# Patient Record
Sex: Female | Born: 1960 | Race: Black or African American | Hispanic: No | Marital: Single
Health system: Northeastern US, Academic
[De-identification: ages and names within clinical notes are randomized; demographics above are authoritative.]

---

## 2019-05-17 ENCOUNTER — Encounter: Admit: 2019-05-17 | Payer: PRIVATE HEALTH INSURANCE

## 2019-05-17 DIAGNOSIS — E119 Type 2 diabetes mellitus without complications: Secondary | ICD-10-CM

## 2019-06-11 ENCOUNTER — Encounter: Admit: 2019-06-11 | Payer: PRIVATE HEALTH INSURANCE

## 2019-06-11 ENCOUNTER — Ambulatory Visit: Admit: 2019-06-11 | Payer: PRIVATE HEALTH INSURANCE | Attending: Registered"

## 2019-06-11 ENCOUNTER — Telehealth: Admit: 2019-06-11 | Payer: PRIVATE HEALTH INSURANCE | Attending: Registered"

## 2019-06-11 NOTE — Telephone Encounter
Called pt to schedule appt. No Answer, left VM, and sent Mychart message to contact our office.Please next available with Toniann Fail

## 2019-06-15 ENCOUNTER — Telehealth: Admit: 2019-06-15 | Payer: PRIVATE HEALTH INSURANCE | Attending: Registered"

## 2019-06-15 ENCOUNTER — Telehealth: Admit: 2019-06-15 | Payer: PRIVATE HEALTH INSURANCE

## 2019-06-15 NOTE — Telephone Encounter
Called pt to schedule appointment, left voicemail and my chart message to contact office.Please schedule next available visit with Toniann Fail

## 2019-06-15 NOTE — Telephone Encounter
Bariatric surgery processRescheduling Nutrition appointment

## 2019-07-02 ENCOUNTER — Encounter: Admit: 2019-07-02 | Payer: MEDICAID | Attending: Interventional Cardiology

## 2019-07-02 ENCOUNTER — Encounter: Admit: 2019-07-02 | Payer: PRIVATE HEALTH INSURANCE | Attending: Interventional Cardiology

## 2019-07-02 DIAGNOSIS — I480 Paroxysmal atrial fibrillation: Secondary | ICD-10-CM

## 2019-07-02 DIAGNOSIS — E7800 Pure hypercholesterolemia, unspecified: Secondary | ICD-10-CM

## 2019-07-02 DIAGNOSIS — I4891 Unspecified atrial fibrillation: Secondary | ICD-10-CM

## 2019-07-02 DIAGNOSIS — E611 Iron deficiency: Secondary | ICD-10-CM

## 2019-07-02 DIAGNOSIS — I10 Essential (primary) hypertension: Secondary | ICD-10-CM

## 2019-07-02 DIAGNOSIS — G4733 Obstructive sleep apnea (adult) (pediatric): Secondary | ICD-10-CM

## 2019-07-02 DIAGNOSIS — Z9989 Dependence on other enabling machines and devices: Secondary | ICD-10-CM

## 2019-07-02 DIAGNOSIS — E119 Type 2 diabetes mellitus without complications: Secondary | ICD-10-CM

## 2019-07-02 DIAGNOSIS — G629 Polyneuropathy, unspecified: Secondary | ICD-10-CM

## 2019-07-02 NOTE — Progress Notes
Patient was identified as a fall risk.  Universal Fall precautions protocol maintained during vist; Room was free of clutter, personal items were within reach, and verification that all needs were met prior to leaving the examination room.  Additional Universal Fall Interventions included: Patient informed to remain in chair until clinician is present.Encouraged patient to use their appropriate assistive devices.Family members were encouraged to be with patient.When patient properly clothed and practical, examination room door was left open. Fall risk was identified in EMR with fall risk banner. Unit specific identifiers were applied - including sign outside of room and yellow fall risk bracelet.Fall risk brochures were available.

## 2019-07-02 NOTE — Patient Instructions
Can proceed with surgery If you develop palpitations or feeling your heart racing please let us know

## 2019-07-02 NOTE — Progress Notes
Cardiology Outpatient VisitReason for Visit: follow up History of Present Illness 59 YO F with PMH of HTN, HLD, OSA on CPAP, DM, remote history of a. Fib,  obesity who presents for pre-operative assessment prior to bariatric surgeryShe was seen by Dr. Red Christians in 11/2018 for same reason She had good functional capcity, and previous TTE 10/2017 with normal R/L function, and PET 10/2018 obtained for atypical CP, which was normal with no coroanry calcifications She returns for pre-operative assessment She has no CP SOB palpitations, lightheadedness, dizziness, PND, orthopnea She can climb 2 flights of stairs without CP She did report a history of a. Fib in 2006 and was previously on coumadin.  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  TTE with no LA dilation.  She was symptoamtic w/ palpitations at that time.  She has since had an event monitor without a. Fib in 2019 ECG in clinic NSR Past Medical History:Past Medical History: Diagnosis Date ? A-fib (HC Code)  ? CPAP (continuous positive airway pressure) dependence   has c-pap; doesnt use it ;  too much pressure ? Diabetes mellitus (HC Code)  ? High cholesterol  ? Hypertension  ? Iron deficiency  ? Neuropathy (HC Code)  ? Obstructive sleep apnea   Past Surgical History:Past Surgical History: Procedure Laterality Date ? DENTAL SURGERY   ? SPLIT SLEEP STUDY  03/16/2018  Family Medical History: Social History: Allergies Allergen Reactions ? Lisinopril Angioedema Current Outpatient Medications on File Prior to Visit Medication Sig Dispense Refill ? allopurinoL (ZYLOPRIM) 100 mg tablet Take 1 tablet (100 mg total) by mouth daily. 90 tablet 1 ? aspirin 81 mg EC delayed release tablet Take 1 tablet (81 mg total) by mouth daily. 90 tablet 1 ? blood sugar diagnostic (FREESTYLE LITE) test strips FREESTYLE LITE TEST STRP   ? clotrimazole-betamethasone (LOTRISONE) 1-0.05 % cream Apply topically 2 (two) times daily. 45 g 2 ? co-enzyme Q-10 30 mg capsule Take 30 mg by mouth daily.    ? cyclobenzaprine (FLEXERIL) 5 mg tablet Take 1 tablet (5 mg total) by mouth 2 (two) times daily as needed for muscle spasms. 30 tablet 1 ? ferrous sulfate (FEOSOL) 325 mg (65 mg iron) tablet Take 1 tablet by mouth daily.   ? fluconazole/terbinafine/ibuprofen: 3-2-2% topical in DMSO (compound) Apply topically twice daily to affected nails. Every 7 days remove any buildup medication with nail polish remover or acetone. 15 mL 12 ? fluticasone propionate (FLONASE) 50 mcg/actuation nasal spray Use 2 sprays in each nostril daily. 16 g 1 ? gabapentin (NEURONTIN) 600 mg tablet Take 1 tablet (600 mg total) by mouth 3 (three) times daily with meals. 90 tablet 1 ? ketamine/doxepin/nifedipine/pentoxifylline: 10-5-8-8% topical (compound) Apply topically up to four times daily as needed to feet for symptoms of burning, electricity, and tingling. 60 g 5 ? Lactobacillus acidophilus (PROBIOTIC ORAL) Take by mouth.   ? lancets (RELIAMED LANCET) 30 gauge Misc lancets LANCETS ULTRA THIN 30G   ? levocetirizine (XYZAL) 5 mg tablet Take 1 tablet (5 mg total) by mouth every evening before dinner. 90 tablet 1 ? LIDODERM 5 % Place 2 patches onto the skin daily. Remove & Discard patch within 12 hours. 60 patch 2 ? metFORMIN (GLUCOPHAGE) 500 mg Immediate Release tablet Take 1 tablet (500 mg total) by mouth 2 (two) times daily. 180 tablet 1 ? Miscellaneous Medical Supply Please send 4 legged cane, for stability and chronic knee pain, Dx M25.561 1 each 0 ? multivitamin (MULTIVITAMIN) tablet Take 1 tablet by mouth  daily.   ? rosuvastatin (CRESTOR) 20 mg tablet Take 1 tablet (20 mg total) by mouth daily. 90 tablet 1 ? triamterene-hydroCHLOROthiazide (DYAZIDE) 37.5-25 mg per capsule Take 1 capsule by mouth every morning. 90 capsule 1 ? Miscellaneous Medical Supply 1 pair of knee high compression stockings 15-20 mmHG. Put on in the morning and remove before bed. (Patient not taking: Reported on 07/02/2019) 1 each 0 No current facility-administered medications on file prior to visit.  Review of Systems Review of Systems Constitution: Negative for decreased appetite. HENT: Negative for sore throat.  Eyes: Negative for blurred vision. Cardiovascular: Negative for chest pain. Respiratory: Negative for cough and shortness of breath.  Hematologic/Lymphatic: Positive for bleeding problem. Gastrointestinal: Negative for bloating. Neurological: Negative for dizziness. Psychiatric/Behavioral: Negative for altered mental status.  10 point ROS negative except for what is stated in HPI Vital Signs and Physical Exam There were no vitals filed for this visit.Physical Exam Constitutional: No distress. Eyes: Conjunctivae are normal. Cardiovascular: Regular rhythm and normal pulses. Exam reveals no S3, no S4 and no distant heart sounds. Murmur heard. Holosystolic murmur is present with a grade of 2/6.Pulmonary/Chest: Effort normal and breath sounds normal. No stridor. She has no wheezes. She has no rales. She exhibits no tenderness. Musculoskeletal: Normal range of motion.       General: No edema. Neurological: She is alert and oriented to person, place, and time. Skin: Skin is warm and dry. Laboratory Studies Creatinine Date/Time Value Ref Range Status 01/25/2019 03:59 PM 0.82 0.50 - 1.05 mg/dL Final   Comment:   For patients >64 years of age, the reference limitfor Creatinine is approximately 13% higher for peopleidentified as African-American.  12/02/2018 08:21 AM 0.80 0.40 - 1.30 mg/dL Final 09/81/1914 78:29 AM 0.80 0.40 - 1.30 mg/dL Final Platelets Date/Time Value Ref Range Status 01/25/2019 03:59 PM 355 140 - 400 Thousand/uL Final 12/02/2018 08:21 AM 322 140 - 440 x1000/?L Final Cholesterol Date/Time Value Ref Range Status 12/02/2018 08:21 AM 164 See Comment mg/dL Final   Comment:   Total Cholesterol (mg/dL)        Adults (>56 years)         Children (<18 years)--------------------------------------------------------------------------------Desirable                        <200                       <170Borderline-High                  200-239                    170-199High                             >=240                      >=200                 Triglycerides Date/Time Value Ref Range Status 12/02/2018 08:21 AM 81 See Comment mg/dL Final   Comment:   Triglycerides (mg/dL) ? ? ? ?Adults (>18 years) ? ? ? ? Children (<18 years) --------------------------------------------------------------------------------Desirable ? ? ? ? ? ? ? ? ? ?<150 ? ? ? ? ? ? ? ? ? ? ? Not EstablishedBorderline-High ? ? ? ? ? ? ?150-199 ? ? ? ? ? ? ? ? ? ?Not Established  High ? ? ? ? ? ? ? ? ? ? ? ? 200-499 ? ? ? ? ? ? ? ? ? ?Not Established ?  HDL Date/Time Value Ref Range Status 12/02/2018 08:21 AM 75 >=40 mg/dL Final LDL Direct Date/Time Value Ref Range Status 12/02/2018 08:21 AM 86 See Comment mg/dL Final   Comment:   LDL Cholesterol (mg/dL) ? ? ? ?Adults (>=18 years) ? ? ? ?Children (<18 years) --------------------------------------------------------------------------------Desirable ? ? ? ? ? ? ? ? ? ? ?<100 ? ? ? ? ? ? ? ? ? ? ? <100 Above Desirable                100-129                    Not EstablishedBorderline-High ? ? ? ? ? ? ? ?130-159 ? ? ? ? ? ? ? ? ? ?110-129 High ? ? ? ? ? ? ? ? ? ? ? ? ? 160-189 ? ? ? ? ? ? ? ? ?  >=130 Very High?                     >=190                      Not Established INR Date/Time Value Ref Range Status 07/09/2017 10:52 AM 1.00 0.89 - 1.12 Final   Comment:   Monitor Coumadin with INR ONLY, not PT.THERAPEUTIC RANGE: 2.0-3.0 Lab Results Component Value Date  HGBA1C 6.4 (H) 01/25/2019  HGBA1C 7.0 (H) 12/02/2018  HGBA1C 6.8 (H) 09/02/2018  HGBA1C 6.9 (H) 05/29/2018  Diagnostic Studies EKG: NSR Transthoracic Echocardiogram:Results for orders placed or performed during the hospital encounter of 07/09/17 Echo 2D Complete w Doppler and CFI if Ind Bubbles Image Enhancement and or 3D Result Value Ref Range  Reported Visual Range EF% 55-60 %  Narrative   * Technically difficult study.* Normal left ventricular size, thickness, systolic function and wall motion. LVEF estimated by visual assessment was between 55-60%.  Normal diastolic function and filling pressures.* Normal right ventricular cavity size and systolic function.  Estimated right ventricular systolic pressure is 30 mmHg.* Atria are normal in size.* No significant valvular abnormalities.* No evidence of pericardial effusion.* Compared with the prior study, dated 11/14/2009, there are changes noted.  Mitral regurgitation no longer appreciated though technical differences preclude complete comparison. Impression and Recommendations 59 YO F with PMH of HTN, HLD, OSA on CPAP, DM, obesity who presents for pre-operative assessment prior to bariatric surgeryShe is low risk for low-intermediate risk surgery.  RCRI 0. Can proceed with surgery without furher cardiac testing or intervention Can hold ASA prior to surgery from a cardiac prospectiveShe has a history of a. Fib remotely while she was using crack/cocaine (2006).  She was symptoamtic with palpitations at that time.  She has since had multiple ECGs and an event monitor w/o a. Fib.  She was taken off coumadin remotely as well.  Discussed w/ patient if to develop palpitations to let us know and we can set up an event monitor.  Her LA is normal size.  It may have been provoked a. Fib in the setting of drug use.  Will monitor for [facility] Cardiology Fellow I have discussed the patient in detail with the Fellow,Dr Remus Loffler. I agree with the clinical details, assessment and plan.  I have repeated and validated all of the clinical findings as documented in the above note.  We have  fully discussed and reviewed the outlined treatment plan. I agree that she is very low risk for proposed surgery.  She has had a reassuring cardiac workup.Atrial fibrillation seem to occur her during a drug binge many years ago and should not present any issues either.

## 2019-07-08 ENCOUNTER — Telehealth: Admit: 2019-07-08 | Payer: PRIVATE HEALTH INSURANCE

## 2019-07-08 NOTE — Telephone Encounter
Left voicemail for patient to return my call regarding:Reschedule Nutrition discuss final steps required for the bariatric surgery process.

## 2019-07-14 ENCOUNTER — Telehealth: Admit: 2019-07-14 | Payer: PRIVATE HEALTH INSURANCE

## 2019-07-14 NOTE — Telephone Encounter
Bariatric surgery processSpoke with patient regarding  final required steps of bariatric processPatient quit smokingWorking on weight lossWill request surgeon appt for Meet and Greet , discuss possible medical weight lossNutrition appt 5/6/21Patient to proceed to LAB to retest Nicotine and HgbA1cPatient verbalizes understanding

## 2019-07-28 ENCOUNTER — Encounter: Admit: 2019-07-28 | Payer: PRIVATE HEALTH INSURANCE

## 2019-07-28 ENCOUNTER — Telehealth: Admit: 2019-07-28 | Payer: PRIVATE HEALTH INSURANCE

## 2019-07-28 NOTE — Telephone Encounter
Bariatric surgery processSpoke with patient regarding  final required steps of bariatric processRequest for updated M+G and [place]ree for medical weight loss.Patient verbalizes understanding

## 2019-08-16 ENCOUNTER — Inpatient Hospital Stay: Admit: 2019-08-16 | Discharge: 2019-08-16 | Payer: MEDICAID

## 2019-08-16 DIAGNOSIS — Z20822 Contact with and (suspected) exposure to covid-19: Secondary | ICD-10-CM

## 2019-08-16 DIAGNOSIS — Z20828 Contact with and (suspected) exposure to other viral communicable diseases: Secondary | ICD-10-CM

## 2019-08-17 ENCOUNTER — Encounter: Admit: 2019-08-17 | Payer: MEDICAID | Attending: Family

## 2019-08-17 ENCOUNTER — Encounter: Admit: 2019-08-17 | Payer: PRIVATE HEALTH INSURANCE | Attending: Family

## 2019-08-17 DIAGNOSIS — G4733 Obstructive sleep apnea (adult) (pediatric): Secondary | ICD-10-CM

## 2019-08-17 DIAGNOSIS — E611 Iron deficiency: Secondary | ICD-10-CM

## 2019-08-17 DIAGNOSIS — E119 Type 2 diabetes mellitus without complications: Secondary | ICD-10-CM

## 2019-08-17 DIAGNOSIS — G629 Polyneuropathy, unspecified: Secondary | ICD-10-CM

## 2019-08-17 DIAGNOSIS — E1100 Type 2 diabetes mellitus with hyperosmolarity without nonketotic hyperglycemic-hyperosmolar coma (NKHHC): Secondary | ICD-10-CM

## 2019-08-17 DIAGNOSIS — Z9989 Dependence on other enabling machines and devices: Secondary | ICD-10-CM

## 2019-08-17 DIAGNOSIS — I4891 Unspecified atrial fibrillation: Secondary | ICD-10-CM

## 2019-08-17 DIAGNOSIS — E7800 Pure hypercholesterolemia, unspecified: Secondary | ICD-10-CM

## 2019-08-17 DIAGNOSIS — I10 Essential (primary) hypertension: Secondary | ICD-10-CM

## 2019-08-17 LAB — COVID-19 CLEARANCE OR FOR PLACEMENT ONLY: BKR SARS-COV-2 RNA (COVID-19) (YH): NOT DETECTED

## 2019-08-17 MED ORDER — OZEMPIC 0.25 MG OR 0.5 MG (2 MG/1.5 ML) SUBCUTANEOUS PEN INJECTOR
0.25 mg or 0.5 mg(2 mg/1.5 mL) | SUBCUTANEOUS | 1 refills | Status: AC
Start: 2019-08-17 — End: 2019-10-19

## 2019-08-17 NOTE — Progress Notes
Bethany Wiggins returns for her bariatric follow up visit today to discuss weight loss medication options. The patient is currently going through the bariatric surgery pre-op process- interested in sleeve gastrectomy. She has gained some weight throughout the pre-op process. Diet recall is as follows:B: 2 greek yogurt S: none L: noneS: noneD: baked chicken with vegetableWill occasionally have a piece of cake or candy barDrinks lemon water- 24 ounces daily Drinks 24 ounces of water daily Today she weighs (!) 162.4 kg and her current Body mass index is 63.42 kg/m?Marland Kitchen Weight 358 lbs.  Today, she does complain about reflux- occasionally relieved by tums .She never vomits.She exercises never.Her dietary compliance is fair.Alcohol Use: no  	Frequency: N/ADrug Use: no 		Frequency: N/ANicotine history:no	Packs per day:  N/ANSAID use: no 	Frequency: N/AOral/IV steroid use: no Frequency: N/ATaking vitamins?  yesIf yes, which vitamins are he or she taking?  Multivitamin, ironPHQ-2:Little interest or pleasure in doing things: Not at all Feeling down, depressed, or hopeless: Not at all PHQ-9 Total Score: 0Need for mental health referral: noCurrent Outpatient Medications on File Prior to Visit Medication Sig Dispense Refill ? allopurinoL (ZYLOPRIM) 100 mg tablet Take 1 tablet (100 mg total) by mouth daily. 90 tablet 1 ? aspirin 81 mg EC delayed release tablet Take 1 tablet (81 mg total) by mouth daily. 90 tablet 1 ? blood sugar diagnostic (FREESTYLE LITE) test strips FREESTYLE LITE TEST STRP   ? clotrimazole-betamethasone (LOTRISONE) 1-0.05 % cream Apply topically 2 (two) times daily. 45 g 2 ? co-enzyme Q-10 30 mg capsule Take 30 mg by mouth daily.    ? cyclobenzaprine (FLEXERIL) 5 mg tablet Take 1 tablet (5 mg total) by mouth 2 (two) times daily as needed for muscle spasms. 30 tablet 1 ? ferrous sulfate (FEOSOL) 325 mg (65 mg iron) tablet Take 1 tablet by mouth daily.   ? fluconazole/terbinafine/ibuprofen: 3-2-2% topical in DMSO (compound) Apply topically twice daily to affected nails. Every 7 days remove any buildup medication with nail polish remover or acetone. 15 mL 12 ? fluticasone propionate (FLONASE) 50 mcg/actuation nasal spray Use 2 sprays in each nostril daily. 16 g 1 ? gabapentin (NEURONTIN) 600 mg tablet Take 1 tablet (600 mg total) by mouth 3 (three) times daily with meals. 90 tablet 0 ? ketamine/doxepin/nifedipine/pentoxifylline: 10-5-8-8% topical (compound) Apply topically up to four times daily as needed to feet for symptoms of burning, electricity, and tingling. 60 g 5 ? Lactobacillus acidophilus (PROBIOTIC ORAL) Take by mouth.   ? lancets (RELIAMED LANCET) 30 gauge Misc lancets LANCETS ULTRA THIN 30G   ? levocetirizine (XYZAL) 5 mg tablet Take 1 tablet (5 mg total) by mouth every evening before dinner. 90 tablet 1 ? lidocaine (LIDODERM) 5 % Place 2 patches onto the skin daily. Remove & Discard patch within 12 hours. 30 patch 0 ? metFORMIN (GLUCOPHAGE) 500 mg Immediate Release tablet Take 1 tablet (500 mg total) by mouth 2 (two) times daily. 180 tablet 1 ? Miscellaneous Medical Supply Please send 4 legged cane, for stability and chronic knee pain, Dx M25.561 1 each 0 ? multivitamin (MULTIVITAMIN) tablet Take 1 tablet by mouth daily.   ? rosuvastatin (CRESTOR) 20 mg tablet Take 1 tablet (20 mg total) by mouth daily. 90 tablet 1 ? triamterene-hydroCHLOROthiazide (DYAZIDE) 37.5-25 mg per capsule Take 1 capsule by mouth every morning. 90 capsule 1 ? Miscellaneous Medical Supply 1 pair of knee high compression stockings 15-20 mmHG. Put on in the morning and remove before bed. (Patient not taking: Reported on 07/02/2019) 1 each 0  No current facility-administered medications on file prior to visit.   Physical Exam: Ht 5' 3 (1.6 m)  - Wt (!) 162.4 kg  - BMI 63.42 kg/m? General:  Alert, oriented, no acute distress Recent bariatric labs reviewed: noBariatric labs ordered today: noMs. Lineberry is currrently going through the pre-op process for bariatric surgery and could benefit from pre-op weight loss.Marland Kitchen Has been working with dietician and has made some changes to diet, but exercise limited due to knee pain. Medical weight loss options reviewed, she has opted to trial GLP-1 agonist to aid in weight loss. Ozempic to pharmacy- dose, use, common se and risk reviewed. Denies personal and family history of pancreatitis, thyroid ca/nodules. I informed patient this medication will reduce appetite and will help with overall caloric intake. However, this must be combined with healthy dietary intake and at least 30 minutes of exercise four times a week. Encouraged chair exercises- says she has an ab machine she recently purchased that is still in the car- she and roommate will set up and use. I discussed today that our goal will be 5% TBW (18 lbs) over the course of three months.My recommendations for Bethany Wiggins HYQ:MVHQIONG exerciseImprove dietarty complianceNutrition consult- discuss liquid diet with dietician at next visitTake Multivitamin and Calcium dailyStart Ozempic:            - 0.25 mg under the skin once a week for 4 weeks            - 0.5 mg under the skin once a week thereafterMs. Wiggins will follow up in 2 weeks.  VIDEO TELEHEALTH VISIT: This clinician is part of the telehealth program and is conducting this visit in a currently approved location. For this visit the clinician and patient were present via interactive audio & video telecommunications system that permits real-time communications.Patient/parent or guardian consent given for video visit: Yes State patient is located in: [place]The clinician is appropriately licensed in the above state to provide care for this visit. Other individuals present during the telehealth encounter and their role/relation: noneIf billing based on time, please complete (Not required if billing based on MDM):                           Total time spent in medical video consultation: 30 minutes; Total time spent by the provider on the day of service, which includes time spent on chart review, medical video consultation, education, coordination of care/services and counseling Because this visit was completed over video, a hands-on physical exam was not performed.  Patient/parent or guardian understands and knows to call back if condition changes.

## 2019-09-02 ENCOUNTER — Encounter: Admit: 2019-09-02 | Payer: PRIVATE HEALTH INSURANCE | Attending: Family

## 2019-09-02 ENCOUNTER — Ambulatory Visit: Admit: 2019-09-02 | Payer: PRIVATE HEALTH INSURANCE | Attending: Internal Medicine

## 2019-09-02 ENCOUNTER — Encounter: Admit: 2019-09-02 | Payer: MEDICAID | Attending: Family

## 2019-09-02 DIAGNOSIS — Z9989 Dependence on other enabling machines and devices: Secondary | ICD-10-CM

## 2019-09-02 DIAGNOSIS — E7800 Pure hypercholesterolemia, unspecified: Secondary | ICD-10-CM

## 2019-09-02 DIAGNOSIS — I10 Essential (primary) hypertension: Secondary | ICD-10-CM

## 2019-09-02 DIAGNOSIS — Z20822 Contact with and (suspected) exposure to covid-19: Secondary | ICD-10-CM

## 2019-09-02 DIAGNOSIS — E611 Iron deficiency: Secondary | ICD-10-CM

## 2019-09-02 DIAGNOSIS — E119 Type 2 diabetes mellitus without complications: Secondary | ICD-10-CM

## 2019-09-02 DIAGNOSIS — G4733 Obstructive sleep apnea (adult) (pediatric): Secondary | ICD-10-CM

## 2019-09-02 DIAGNOSIS — I4891 Unspecified atrial fibrillation: Secondary | ICD-10-CM

## 2019-09-02 DIAGNOSIS — G629 Polyneuropathy, unspecified: Secondary | ICD-10-CM

## 2019-09-02 NOTE — Progress Notes
Ms. Prestwood returns for her bariatric follow up visit today to discuss weight loss medication options. The patient is currently going through the bariatric surgery pre-op process- interested in sleeve gastrectomy. She has gained some weight throughout the pre-op process. She was seen 2 weeks ago and started on Ozempic to aid in pre-op weight loss- she reports decreased appetite. Met with dietician and started 2 week liquid diet- on day 10.Drinking 48 ounces of water daily Started Ozempic 5/5/21Starting MWL weight 358lbDiet recall is as follows:5 equite proteins (10g protein each)D: baked chicken with vegetableHas one honey bun yesterday (decreased from before)Today she weighs (!) 160.1 kg and her current Body mass index is 62.53 kg/m?Marland Kitchen Weight 353 lbs.She exercises never.Her dietary compliance is good.Alcohol Use: no  	Frequency: N/ADrug Use: no 		Frequency: N/ANicotine history:no	Packs per day:  N/ANSAID use: no 	Frequency: N/AOral/IV steroid use: no Frequency: N/ATaking vitamins?  yesIf yes, which vitamins are he or she taking?  Multivitamin, ironPHQ-2:Little interest or pleasure in doing things: Not at all Feeling down, depressed, or hopeless: Not at all PHQ-9 Total Score: 0Need for mental health referral: noCurrent Outpatient Medications on File Prior to Visit Medication Sig Dispense Refill ? allopurinoL (ZYLOPRIM) 100 mg tablet Take 1 tablet (100 mg total) by mouth daily. 90 tablet 1 ? aspirin 81 mg EC delayed release tablet Take 1 tablet (81 mg total) by mouth daily. 90 tablet 1 ? blood sugar diagnostic (FREESTYLE LITE) test strips FREESTYLE LITE TEST STRP   ? clotrimazole-betamethasone (LOTRISONE) 1-0.05 % cream Apply topically 2 (two) times daily. 45 g 2 ? co-enzyme Q-10 30 mg capsule Take 30 mg by mouth daily.    ? cyclobenzaprine (FLEXERIL) 5 mg tablet Take 1 tablet (5 mg total) by mouth 2 (two) times daily as needed for muscle spasms. 30 tablet 1 ? ferrous sulfate (FEOSOL) 325 mg (65 mg iron) tablet Take 1 tablet by mouth daily.   ? fluticasone propionate (FLONASE) 50 mcg/actuation nasal spray Use 2 sprays in each nostril daily. 16 g 1 ? gabapentin (NEURONTIN) 600 mg tablet Take 1 tablet (600 mg total) by mouth 3 (three) times daily with meals. 90 tablet 1 ? ketamine/doxepin/nifedipine/pentoxifylline: 10-5-8-8% topical (compound) Apply topically up to four times daily as needed to feet for symptoms of burning, electricity, and tingling. 60 g 5 ? Lactobacillus acidophilus (PROBIOTIC ORAL) Take by mouth.   ? lancets (RELIAMED LANCET) 30 gauge Misc lancets LANCETS ULTRA THIN 30G   ? levocetirizine (XYZAL) 5 mg tablet Take 1 tablet (5 mg total) by mouth every evening before dinner. 90 tablet 1 ? lidocaine (LIDODERM) 5 % Place 2 patches onto the skin daily. Remove & Discard patch within 12 hours. 30 patch 1 ? metFORMIN (GLUCOPHAGE) 500 mg Immediate Release tablet Take 1 tablet (500 mg total) by mouth 2 (two) times daily. 180 tablet 1 ? Misc[employer] 1 pair of knee high compression stockings 15-20 mmHG. Put on in the morning and remove before bed. 1 each 0 ? Miscellaneous Medical Supply Please send 4 legged cane, for stability and chronic knee pain, Dx M25.561 1 each 0 ? multivitamin (MULTIVITAMIN) tablet Take 1 tablet by mouth daily.   ? rosuvastatin (CRESTOR) 20 mg tablet Take 1 tablet (20 mg total) by mouth daily. 90 tablet 1 ? semaglutide (OZEMPIC) 0.25 mg or 0.5 mg(2 mg/1.5 mL) pen injector Inject 0.25 mg under the skin once a week for 28 days, THEN 0.5 mg once a week. 3 mL 0 ? triamterene-hydroCHLOROthiazide (DYAZIDE) 37.5-25 mg per capsule Take 1  capsule by mouth every morning. 90 capsule 1 ? fluconazole/terbinafine/ibuprofen: 3-2-2% topical in DMSO (compound) Apply topically twice daily to affected nails. Every 7 days remove any buildup medication with nail polish remover or acetone. (Patient not taking: Reported on 09/02/2019) 15 mL 12 No current facility-administered medications on file prior to visit.   Physical Exam: Ht 5' 3 (1.6 m)  - Wt (!) 160.1 kg  - BMI 62.53 kg/m? General:  Alert, oriented, no acute distress Recent bariatric labs reviewed: noBariatric labs ordered today: noMs. Jakel is currrently going through the pre-op process for bariatric surgery and would benefit from pre-op weight loss.Marland Kitchen Has been working with dietician and has made some changes to diet, but exercise limited due to knee pain. Was seen 2 weeks ago and started on Ozempic to aid in weight management- she reports decreased appetite. She met with dietician and is currently completing 2 week liquid. Reminded patient this medication will reduce appetite and will help with overall caloric intake. However, this must be combined with healthy dietary intake and at least 30 minutes of exercise four times a week. Encouraged increased exercise as able. Increase hydration. Will return to see me in 6 weeks. Patient amenable to plan.My recommendations for Ms. Mucha [id number] exerciseContinue with good dietary habits- increase hydrationNutrition consultTake Multivitamin and Calcium dailyContinue Ozempic titration Ms. Durflinger will follow up in 6 weeks.  VIDEO TELEHEALTH VISIT: This clinician is part of the telehealth program and is conducting this visit in a currently approved location. For this visit the clinician and patient were present via interactive audio & video telecommunications system that permits real-time communications.Patient/parent or guardian consent given for video visit: Yes State patient is located in: [place]The clinician is appropriately licensed in the above state to provide care for this visit. Other individuals present during the telehealth encounter and their role/relation: noneIf billing based on time, please complete (Not required if billing based on MDM):                           Total time spent in medical video consultation: 30 minutes; Total time spent by the provider on the day of service, which includes time spent on chart review, medical video consultation, education, coordination of care/services and counseling Because this visit was completed over video, a hands-on physical exam was not performed.  Patient/parent or guardian understands and knows to call back if condition changes.

## 2019-09-10 ENCOUNTER — Ambulatory Visit: Admit: 2019-09-10 | Payer: PRIVATE HEALTH INSURANCE | Attending: Internal Medicine

## 2019-09-10 DIAGNOSIS — Z20822 Contact with and (suspected) exposure to covid-19: Secondary | ICD-10-CM

## 2019-10-01 ENCOUNTER — Ambulatory Visit: Admit: 2019-10-01 | Payer: PRIVATE HEALTH INSURANCE | Attending: Surgery

## 2019-10-01 ENCOUNTER — Encounter: Admit: 2019-10-01 | Payer: PRIVATE HEALTH INSURANCE | Attending: Surgery

## 2019-10-01 DIAGNOSIS — I4891 Unspecified atrial fibrillation: Secondary | ICD-10-CM

## 2019-10-01 DIAGNOSIS — Z9989 Dependence on other enabling machines and devices: Secondary | ICD-10-CM

## 2019-10-01 DIAGNOSIS — G629 Polyneuropathy, unspecified: Secondary | ICD-10-CM

## 2019-10-01 DIAGNOSIS — E611 Iron deficiency: Secondary | ICD-10-CM

## 2019-10-01 DIAGNOSIS — E119 Type 2 diabetes mellitus without complications: Secondary | ICD-10-CM

## 2019-10-01 DIAGNOSIS — E7800 Pure hypercholesterolemia, unspecified: Secondary | ICD-10-CM

## 2019-10-01 DIAGNOSIS — G4733 Obstructive sleep apnea (adult) (pediatric): Secondary | ICD-10-CM

## 2019-10-01 DIAGNOSIS — I10 Essential (primary) hypertension: Secondary | ICD-10-CM

## 2019-10-01 NOTE — Progress Notes
I had the pleasure of seeing one of your patients Bethany Wiggins in consultation today for laparoscopic sleeve gastrectomy.  This patient is a 59 y.o. female with a Body mass index is 63.59 kg/m?. based on a height of Height: 5' 3 (160 cm)(per pt) and weight of (!) 162.8 kg.This patient reported a history of a peak weight of 400.  She has been overweight since childhood.  Bethany Wiggins has tried numerous weight loss regimens over the years including Dietician Counseling. Her associate medical conditions include: diabetes mellitus.  Her cardiovascular risk factors besides obesity include: obesity (BMI >= 30 kg/m2).Current Outpatient Medications: ?  allopurinoL, 100 mg, Oral, Daily?  aspirin, 81 mg, Oral, Daily?  blood sugar diagnostic, FREESTYLE LITE TEST STRP?  clotrimazole-betamethasone, Apply topically 2 (two) times daily.?  co-enzyme Q-10, 30 mg, Oral, Daily?  cyclobenzaprine, Take 1 tablet (5 mg total) by mouth 2 (two) times daily as needed for muscle spasms.?  erythromycin, Place into the left eye nightly for 10 days. Apply 1 cm ribbon to inside lower eyelid or affected area.?  ferrous sulfate, 1 tablet, Oral, Daily?  fluticasone propionate, 2 spray, each nostril, Daily?  gabapentin, 600 mg, Oral, TID WC?  ketamine/doxepin/nifedipine/pentoxifylline: 10-5-8-8% topical (compound), Apply topically up to four times daily as needed to feet for symptoms of burning, electricity, and tingling.?  ketamine/doxepin/nifedipine/pentoxifylline: 10-5-8-8% topical (compound), Apply topically up to four times daily as needed to feet for symptoms of burning, electricity, and tingling.?  Lactobacillus acidophilus (PROBIOTIC ORAL), Take by mouth.?  lancets, LANCETS ULTRA THIN 30G?  levocetirizine, 5 mg, Oral, QPM AC?  lidocaine, Place 2 patches onto the skin daily. Remove & Discard patch within 12 hours.?  metFORMIN, 500 mg, Oral, BID?  M[employer]ellaneous [employer], 1 pair of knee high compression stockings 15-20 mmHG. Put on in the morning and remove before bed.?  Miscellaneous Medical Supply, Please send 4 legged cane, for stability and chronic knee pain, Dx M25.561?  multivitamin, 1 tablet, Oral, Daily?  rosuvastatin, 20 mg, Oral, Daily?  Ozempic, Inject 0.25 mg under the skin once a week for 28 days, THEN 0.5 mg once a week.?  triamterene-hydroCHLOROthiazide, 1 capsule, Oral, QAM?  fluconazole/terbinafine/ibuprofen: 3-2-2% topical in DMSO (compound), Apply topically twice daily to affected nails. Every 7 days remove any buildup medication with nail polish remover or acetone. She is allergic to lisinopril.She  has a past medical history of A-fib (HC Code), CPAP (continuous positive airway pressure) dependence, Diabetes mellitus (HC Code), High cholesterol, Hypertension, Iron deficiency, Neuropathy (HC Code), and Obstructive sleep apnea. She  has a past surgical history that includes Split Sleep Study (03/16/2018) and Dental surgery. She  reports that she quit smoking about a year ago. Her smoking use included cigarettes. She has a 4.00 pack-year smoking history. She has never used smokeless tobacco. She reports previous drug use. Drug: Cocaine. She reports that she does not drink alcohol. Review of SystemsPertinent items are noted in HPI.    My assessment of Bethany Wiggins is that she is an excellent candidate for bariatric surgery.  My plan is to schedule the procedure in the near future. The risks and benefits of the surgery were explained in detail to Bethany Wiggins  .  The patient understands these risks and wishes to proceed.  Preoperatively, I will order a set of labs to assess her nutritional status.Thank you very much for involving me in your patient's care.  Please do not hesitate to call me with any questions you may have. Dorann Ou,  MD, MPH, FACS, FASMBSVice Chair, QualityDivision Chief, Bariatric and Minimally Invasive SurgeryYale Department of [email or web address] TELEHEALTH VISIT: This clinician is part of the telehealth program and is conducting this visit in a currently approved location. For this visit the clinician and patient were present via interactive audio & video telecommunications system that permits real-time communications. Consent was signed via the Patient Acknowledgement and Financial Authorization Form and the Ambulatory Telehealth Consent Form. State patient is located in: [place]The clinician is appropriately licensed in the above state to provide care for this visit. Other individuals present during the telehealth encounter and their role/relation: noneIf billing based on time, please complete (Not required if billing based on MDM):                           Total time spent in medical video consultation: 30; Total time spent by the provider on the day of service, which includes time spent on chart review, medical video consultation, education, coordination of care/services and counseling Because this visit was completed over video, a hands-on physical exam was not performed.  Patient/parent or guardian understands and knows to call back if condition changes.

## 2019-10-05 ENCOUNTER — Encounter: Admit: 2019-10-05 | Payer: PRIVATE HEALTH INSURANCE

## 2019-10-07 ENCOUNTER — Encounter: Admit: 2019-10-07 | Payer: PRIVATE HEALTH INSURANCE

## 2019-10-07 ENCOUNTER — Encounter: Admit: 2019-10-07 | Payer: PRIVATE HEALTH INSURANCE | Attending: Pulmonary Disease

## 2019-10-07 NOTE — Progress Notes
Please see patient message.

## 2019-10-07 NOTE — Progress Notes
The patient has the following:[]  Ventilator[x]  BPAP[]  O2[x]  severe OSA (AHI >30)[]  daytime sleepiness[]  medical disease: pulmonary, cardiac, neurologic[]  safety critical activity: driver, pilot, heavy equipment operatorPSG 2019:PSG 03/2018:?SEVERE?Obstructive Sleep Apnea (G47.33): Diagnostic portion?AHI of 32/hr. (AHI 4% 22/hr.),?mean SpO2 85%, nadir SpO2 72%, 78% total recording time with SpO2 <88%.?There were cyclical events associated with significant oxygen desaturations. - There was sustained sleep-related hypoxemia with low SpO2 in the 84% that was independent of obstructive events.- The patient had a remarkable response to PAP therapy. At CPAP 17 cmH2O, the patient's cyclical apneas stopped. However, this was also the pressure in which REM sleep ended and in which the patient turned lateral to sleep. As a result, it is difficult to say if this was the Pcrit pressure, or if the other factors caused the dramatic improvement in breathing. The patient had another stretch of long REM time and did well on BPAP 22/18, however, this was only lateral REM sleep. The most exaggerated desaturations occurred during supine REM sleep. - The patient's oxygenation improvement markedly/normalized with CPAP therapy, and improved even more on BPAP therapy.- The patient did note during the study that CPAP setting of 17-18 felt too high, and seemed to feel more comfortable on BPAP.???The manufacturer's recall details have been discussed with the patient, and the Recall website address and phone number have been provided for more information. The patient has assessed their risks and benefits of continuing or discontinuing their PAP device, and the patient has opted to CONTINUE PAP use with their [employer] device. The process of registering their device and seeking a replacement from the manufacturer has been discussed. The patient is aware that there may be delays in obtaining a new PAP device or getting it fixed by [employer], and that the timeline is unknown at this time. The patient was cautioned not to use any ozone cleaning devices. If the patient has any questions or concerns in the future, they were counseled to contact us.ELECTRONICALLY SIGNED BY DR. Arvid Right, MD, October 07, 2019 at 6:16 PM

## 2019-10-15 ENCOUNTER — Inpatient Hospital Stay: Admit: 2019-10-15 | Discharge: 2019-10-15 | Payer: MEDICAID

## 2019-10-15 DIAGNOSIS — Z1231 Encounter for screening mammogram for malignant neoplasm of breast: Secondary | ICD-10-CM

## 2019-10-21 ENCOUNTER — Telehealth: Admit: 2019-10-21 | Payer: PRIVATE HEALTH INSURANCE

## 2019-10-21 NOTE — Telephone Encounter
Bariatric surgery processSpoke with patient regarding  final required steps of bariatric processRescheduling Nutrition Assessment updateLABS and Psych Assessment expire 8/17/21Weight loss goal 15 #s per Dr Rosealee Albee w/ APRN Dupree 10/26/19 and RD 7/21/21Nicotine LABPatient verbalizes understanding

## 2019-10-26 ENCOUNTER — Encounter: Admit: 2019-10-26 | Payer: MEDICAID | Attending: Family

## 2019-10-26 ENCOUNTER — Encounter: Admit: 2019-10-26 | Payer: PRIVATE HEALTH INSURANCE | Attending: Family

## 2019-10-26 DIAGNOSIS — Z9989 Dependence on other enabling machines and devices: Secondary | ICD-10-CM

## 2019-10-26 DIAGNOSIS — E7800 Pure hypercholesterolemia, unspecified: Secondary | ICD-10-CM

## 2019-10-26 DIAGNOSIS — E119 Type 2 diabetes mellitus without complications: Secondary | ICD-10-CM

## 2019-10-26 DIAGNOSIS — I4891 Unspecified atrial fibrillation: Secondary | ICD-10-CM

## 2019-10-26 DIAGNOSIS — E1100 Type 2 diabetes mellitus with hyperosmolarity without nonketotic hyperglycemic-hyperosmolar coma (NKHHC): Secondary | ICD-10-CM

## 2019-10-26 DIAGNOSIS — G4733 Obstructive sleep apnea (adult) (pediatric): Secondary | ICD-10-CM

## 2019-10-26 DIAGNOSIS — E611 Iron deficiency: Secondary | ICD-10-CM

## 2019-10-26 DIAGNOSIS — G629 Polyneuropathy, unspecified: Secondary | ICD-10-CM

## 2019-10-26 DIAGNOSIS — I10 Essential (primary) hypertension: Secondary | ICD-10-CM

## 2019-10-26 MED ORDER — OZEMPIC 1 MG/DOSE (2 MG/1.5 ML) SUBCUTANEOUS PEN INJECTOR
1 mg/dose (2 mg/.5 mL) | SUBCUTANEOUS | 1 refills | Status: AC
Start: 2019-10-26 — End: 2019-11-24

## 2019-10-26 NOTE — Progress Notes
Bethany Wiggins returns for her bariatric follow up visit today to discuss weight loss medication options. The patient is currently going through the bariatric surgery pre-op process- interested in sleeve gastrectomy. She has gained some weight throughout the pre-op process. She was started on Ozempic to aid in pre-op weight loss- she reports decreased appetite. Drinking 32 ounces of water daily Started Ozempic 5/5/21Starting MWL weight 358lbDiet recall is as follows:B: cantaloupe S: noneL: tuna fish on ritz crackers S: noneD: none OR protein shake Today she weighs (!) 162.8 kg and her current Body mass index is 63.59 kg/m?Marland Kitchen Weight 359 lbs.She exercises never.Her dietary compliance is good.Alcohol Use: no  	Frequency: N/ADrug Use: no 		Frequency: N/ANicotine history:no	Packs per day:  N/ANSAID use: no 	Frequency: N/AOral/IV steroid use: no Frequency: N/ATaking vitamins?  yesIf yes, which vitamins are he or she taking?  Multivitamin, ironPHQ-2:Little interest or pleasure in doing things: Not at all Feeling down, depressed, or hopeless: Not at all PHQ-9 Total Score: 0Need for mental health referral: noCurrent Outpatient Medications on File Prior to Visit Medication Sig Dispense Refill ? allopurinoL (ZYLOPRIM) 100 mg tablet Take 1 tablet (100 mg total) by mouth daily. 90 tablet 1 ? aspirin 81 mg EC delayed release tablet Take 1 tablet (81 mg total) by mouth daily. 90 tablet 1 ? blood sugar diagnostic (FREESTYLE LITE) test strips FREESTYLE LITE TEST STRP   ? clindamycin (CLEOCIN T) 1 % external solution Apply topically 2 (two) times daily. 30 mL 0 ? clotrimazole-betamethasone (LOTRISONE) 1-0.05 % cream Apply topically 2 (two) times daily. 45 g 2 ? co-enzyme Q-10 30 mg capsule Take 30 mg by mouth daily.    ? cyclobenzaprine (FLEXERIL) 5 mg tablet Take 1 tablet (5 mg total) by mouth 2 (two) times daily as needed for muscle spasms. 30 tablet 1 ? ferrous sulfate (FEOSOL) 325 mg (65 mg iron) tablet Take 1 tablet by mouth daily.   ? fluticasone propionate (FLONASE) 50 mcg/actuation nasal spray Use 2 sprays in each nostril daily. 16 g 1 ? furosemide (LASIX) 20 mg tablet Take 1 tablet (20 mg total) by mouth daily. For leg swelling 30 tablet 1 ? gabapentin (NEURONTIN) 600 mg tablet Take 1 tablet (600 mg total) by mouth 3 (three) times daily with meals. 90 tablet 1 ? ketamine/doxepin/nifedipine/pentoxifylline: 10-5-8-8% topical (compound) Apply topically up to four times daily as needed to feet for symptoms of burning, electricity, and tingling. 60 g 5 ? ketamine/doxepin/nifedipine/pentoxifylline: 10-5-8-8% topical (compound) Apply topically up to four times daily as needed to feet for symptoms of burning, electricity, and tingling. 60 g 5 ? Lactobacillus acidophilus (PROBIOTIC ORAL) Take by mouth.   ? lancets (RELIAMED LANCET) 30 gauge Misc lancets LANCETS ULTRA THIN 30G   ? levocetirizine (XYZAL) 5 mg tablet Take 1 tablet (5 mg total) by mouth every evening before dinner. 90 tablet 1 ? lidocaine (LIDODERM) 5 % Place 2 patches onto the skin daily. Remove & Discard patch within 12 hours. 30 patch 1 ? metFORMIN (GLUCOPHAGE) 500 mg Immediate Release tablet Take 1 tablet (500 mg total) by mouth 2 (two) times daily. 180 tablet 1 ? Miscellaneous Medical [employer] 1 pair of knee high compression stockings 15-20 mmHG. Put on in the morning and remove before bed. 1 each 0 ? Miscellaneous Medical Supply Please send 4 legged cane, for stability and chronic knee pain, Dx M25.561 1 each 0 ? multivitamin (MULTIVITAMIN) tablet Take 1 tablet by mouth daily.   ? rosuvastatin (CRESTOR) 20 mg tablet Take 1 tablet (20 mg total) by  mouth daily. 90 tablet 1 ? semaglutide (OZEMPIC) 0.25 mg or 0.5 mg(2 mg/1.5 mL) pen injector Inject 0.25 mg under the skin once a week for 28 days, THEN 0.5 mg once a week. 3 mL 0 ? triamterene-hydroCHLOROthiazide (DYAZIDE) 37.5-25 mg per capsule Take 1 capsule by mouth every morning. 90 capsule 1 ? fluconazole/terbinafine/ibuprofen: 3-2-2% topical in DMSO (compound) Apply topically twice daily to affected nails. Every 7 days remove any buildup medication with nail polish remover or acetone. (Patient not taking: Reported on 09/02/2019) 15 mL 12 No current facility-administered medications on file prior to visit.   Physical Exam: Ht 5' 3 (1.6 m)  - Wt (!) 162.8 kg  - BMI 63.59 kg/m? General:  Alert, oriented, no acute distress Recent bariatric labs reviewed: noBariatric labs ordered today: noMs. Wiggins is currrently going through the pre-op process for bariatric surgery and would benefit from pre-op weight loss.Marland Kitchen Has been working with dietician and has made some changes to diet, but exercise limited due to knee pain. She is currently taking Ozempic to aid in weight management- doing well overall. Will plan to increase Ozempic to 1 mg weekly. Reminded patient this medication will reduce appetite and will help with overall caloric intake. However, this must be combined with healthy dietary intake and at least 30 minutes of exercise four times a week. Encouraged increased exercise as able. Increase hydration. Will return to see me in 7 weeks. Patient amenable to plan.My recommendations for Bethany Wiggins [id number] exerciseContinue with good dietary habits- increase hydrationNutrition consultTake Multivitamin and Calcium dailyContinue Ozempic- increase to 1 mg weeklyMs. Wiggins will follow up in 7 weeks.  VIDEO TELEHEALTH VISIT: This clinician is part of the telehealth program and is conducting this visit in a currently approved location. For this visit the clinician and patient were present via interactive audio & video telecommunications system that permits real-time communications.Consent was signed via the Patient Acknowledgement and Financial Authorization Form and the Ambulatory Telehealth Consent Form. State patient is located in: [place]The clinician is appropriately licensed in the above state to provide care for this visit. Other individuals present during the telehealth encounter and their role/relation: noneIf billing based on time, please complete (Not required if billing based on MDM):                           Total time spent in medical video consultation: 30 minutes; Total time spent by the provider on the day of service, which includes time spent on chart review, medical video consultation, education, coordination of care/services and counseling Because this visit was completed over video, a hands-on physical exam was not performed.  Patient/parent or guardian understands and knows to call back if condition changes.

## 2019-10-27 ENCOUNTER — Encounter: Admit: 2019-10-27 | Payer: PRIVATE HEALTH INSURANCE

## 2019-11-03 ENCOUNTER — Inpatient Hospital Stay: Admit: 2019-11-03 | Discharge: 2019-11-03 | Payer: MEDICAID

## 2019-11-03 DIAGNOSIS — L0231 Cutaneous abscess of buttock: Secondary | ICD-10-CM

## 2019-11-07 NOTE — Other
Please inform patient the ultrasound of her legs did not find any blood clots.

## 2019-11-22 ENCOUNTER — Encounter: Admit: 2019-11-22 | Payer: MEDICAID | Attending: Psychiatry

## 2019-11-30 ENCOUNTER — Ambulatory Visit: Admit: 2019-11-30 | Payer: PRIVATE HEALTH INSURANCE

## 2019-11-30 ENCOUNTER — Inpatient Hospital Stay: Admit: 2019-11-30 | Discharge: 2019-11-30 | Payer: MEDICAID

## 2019-11-30 ENCOUNTER — Ambulatory Visit: Admit: 2019-11-30 | Payer: PRIVATE HEALTH INSURANCE | Attending: Family Medicine

## 2019-11-30 DIAGNOSIS — E559 Vitamin D deficiency, unspecified: Secondary | ICD-10-CM

## 2019-11-30 DIAGNOSIS — I480 Paroxysmal atrial fibrillation: Secondary | ICD-10-CM

## 2019-11-30 DIAGNOSIS — K909 Intestinal malabsorption, unspecified: Secondary | ICD-10-CM

## 2019-11-30 DIAGNOSIS — E8809 Other disorders of plasma-protein metabolism, not elsewhere classified: Secondary | ICD-10-CM

## 2019-11-30 DIAGNOSIS — M545 Chronic bilateral low back pain without sciatica: Secondary | ICD-10-CM

## 2019-11-30 DIAGNOSIS — L600 Ingrowing nail: Secondary | ICD-10-CM

## 2019-11-30 DIAGNOSIS — B351 Tinea unguium: Secondary | ICD-10-CM

## 2019-11-30 DIAGNOSIS — E1100 Type 2 diabetes mellitus with hyperosmolarity without nonketotic hyperglycemic-hyperosmolar coma (NKHHC): Secondary | ICD-10-CM

## 2019-11-30 DIAGNOSIS — F1421 Cocaine dependence, in remission: Secondary | ICD-10-CM

## 2019-11-30 DIAGNOSIS — R700 Elevated erythrocyte sedimentation rate: Secondary | ICD-10-CM

## 2019-11-30 DIAGNOSIS — K2970 Gastritis, unspecified, without bleeding: Secondary | ICD-10-CM

## 2019-11-30 DIAGNOSIS — G8929 Other chronic pain: Secondary | ICD-10-CM

## 2019-11-30 DIAGNOSIS — M25562 Pain in left knee: Secondary | ICD-10-CM

## 2019-11-30 DIAGNOSIS — I10 Essential (primary) hypertension: Secondary | ICD-10-CM

## 2019-11-30 DIAGNOSIS — F17200 Nicotine dependence, unspecified, uncomplicated: Secondary | ICD-10-CM

## 2019-11-30 DIAGNOSIS — E119 Type 2 diabetes mellitus without complications: Secondary | ICD-10-CM

## 2019-11-30 DIAGNOSIS — G4733 Obstructive sleep apnea (adult) (pediatric): Secondary | ICD-10-CM

## 2019-11-30 DIAGNOSIS — E538 Deficiency of other specified B group vitamins: Secondary | ICD-10-CM

## 2019-11-30 DIAGNOSIS — Z0189 Encounter for other specified special examinations: Secondary | ICD-10-CM

## 2019-11-30 DIAGNOSIS — D229 Melanocytic nevi, unspecified: Secondary | ICD-10-CM

## 2019-11-30 DIAGNOSIS — K912 Postsurgical malabsorption, not elsewhere classified: Secondary | ICD-10-CM

## 2019-11-30 DIAGNOSIS — M25561 Pain in right knee: Secondary | ICD-10-CM

## 2019-11-30 DIAGNOSIS — L0231 Cutaneous abscess of buttock: Secondary | ICD-10-CM

## 2019-11-30 DIAGNOSIS — E786 Lipoprotein deficiency: Secondary | ICD-10-CM

## 2019-11-30 DIAGNOSIS — E785 Hyperlipidemia, unspecified: Secondary | ICD-10-CM

## 2019-11-30 DIAGNOSIS — J310 Chronic rhinitis: Secondary | ICD-10-CM

## 2019-11-30 DIAGNOSIS — M542 Cervicalgia: Secondary | ICD-10-CM

## 2019-11-30 DIAGNOSIS — E611 Iron deficiency: Secondary | ICD-10-CM

## 2019-11-30 DIAGNOSIS — E519 Thiamine deficiency, unspecified: Secondary | ICD-10-CM

## 2019-11-30 DIAGNOSIS — R809 Proteinuria, unspecified: Secondary | ICD-10-CM

## 2019-11-30 DIAGNOSIS — M25512 Pain in left shoulder: Secondary | ICD-10-CM

## 2019-11-30 DIAGNOSIS — E039 Hypothyroidism, unspecified: Secondary | ICD-10-CM

## 2019-11-30 DIAGNOSIS — M25511 Pain in right shoulder: Secondary | ICD-10-CM

## 2019-11-30 DIAGNOSIS — E213 Hyperparathyroidism, unspecified: Secondary | ICD-10-CM

## 2019-11-30 DIAGNOSIS — M1049 Other secondary gout, multiple sites: Secondary | ICD-10-CM

## 2019-11-30 DIAGNOSIS — G5710 Meralgia paresthetica, unspecified lower limb: Secondary | ICD-10-CM

## 2019-11-30 DIAGNOSIS — B9681 Helicobacter pylori [H. pylori] as the cause of diseases classified elsewhere: Secondary | ICD-10-CM

## 2019-11-30 DIAGNOSIS — R600 Localized edema: Secondary | ICD-10-CM

## 2019-12-01 ENCOUNTER — Telehealth: Admit: 2019-12-01 | Payer: PRIVATE HEALTH INSURANCE

## 2019-12-01 ENCOUNTER — Encounter: Admit: 2019-12-01 | Payer: PRIVATE HEALTH INSURANCE

## 2019-12-01 DIAGNOSIS — E119 Type 2 diabetes mellitus without complications: Secondary | ICD-10-CM

## 2019-12-01 DIAGNOSIS — E538 Deficiency of other specified B group vitamins: Secondary | ICD-10-CM

## 2019-12-01 DIAGNOSIS — E519 Thiamine deficiency, unspecified: Secondary | ICD-10-CM

## 2019-12-01 DIAGNOSIS — E611 Iron deficiency: Secondary | ICD-10-CM

## 2019-12-01 DIAGNOSIS — E559 Vitamin D deficiency, unspecified: Secondary | ICD-10-CM

## 2019-12-01 DIAGNOSIS — E213 Hyperparathyroidism, unspecified: Secondary | ICD-10-CM

## 2019-12-01 DIAGNOSIS — K909 Intestinal malabsorption, unspecified: Secondary | ICD-10-CM

## 2019-12-01 DIAGNOSIS — Z0189 Encounter for other specified special examinations: Secondary | ICD-10-CM

## 2019-12-01 DIAGNOSIS — E039 Hypothyroidism, unspecified: Secondary | ICD-10-CM

## 2019-12-01 DIAGNOSIS — F17200 Nicotine dependence, unspecified, uncomplicated: Secondary | ICD-10-CM

## 2019-12-01 DIAGNOSIS — K912 Postsurgical malabsorption, not elsewhere classified: Secondary | ICD-10-CM

## 2019-12-01 DIAGNOSIS — E786 Lipoprotein deficiency: Secondary | ICD-10-CM

## 2019-12-01 DIAGNOSIS — K2970 Gastritis, unspecified, without bleeding: Secondary | ICD-10-CM

## 2019-12-01 DIAGNOSIS — E8809 Other disorders of plasma-protein metabolism, not elsewhere classified: Secondary | ICD-10-CM

## 2019-12-01 DIAGNOSIS — Z5181 Encounter for therapeutic drug level monitoring: Secondary | ICD-10-CM

## 2019-12-01 NOTE — Telephone Encounter
Bariatric surgery processSpoke with patient regarding  final required steps of bariatric processUpdate LABs and Psych AssessmentPatient verbalizes understanding

## 2019-12-02 ENCOUNTER — Encounter: Admit: 2019-12-02 | Payer: PRIVATE HEALTH INSURANCE | Attending: Surgery

## 2019-12-02 ENCOUNTER — Inpatient Hospital Stay: Admit: 2019-12-02 | Discharge: 2019-12-02 | Payer: MEDICAID

## 2019-12-02 DIAGNOSIS — E519 Thiamine deficiency, unspecified: Secondary | ICD-10-CM

## 2019-12-02 DIAGNOSIS — Z0189 Encounter for other specified special examinations: Secondary | ICD-10-CM

## 2019-12-02 DIAGNOSIS — E119 Type 2 diabetes mellitus without complications: Secondary | ICD-10-CM

## 2019-12-02 DIAGNOSIS — Z7689 Persons encountering health services in other specified circumstances: Secondary | ICD-10-CM

## 2019-12-02 DIAGNOSIS — K909 Intestinal malabsorption, unspecified: Secondary | ICD-10-CM

## 2019-12-02 DIAGNOSIS — E039 Hypothyroidism, unspecified: Secondary | ICD-10-CM

## 2019-12-02 DIAGNOSIS — E213 Hyperparathyroidism, unspecified: Secondary | ICD-10-CM

## 2019-12-02 DIAGNOSIS — E8809 Other disorders of plasma-protein metabolism, not elsewhere classified: Secondary | ICD-10-CM

## 2019-12-02 DIAGNOSIS — Z6841 Body Mass Index (BMI) 40.0 and over, adult: Secondary | ICD-10-CM

## 2019-12-02 DIAGNOSIS — K2970 Gastritis, unspecified, without bleeding: Secondary | ICD-10-CM

## 2019-12-02 DIAGNOSIS — E611 Iron deficiency: Secondary | ICD-10-CM

## 2019-12-02 DIAGNOSIS — K912 Postsurgical malabsorption, not elsewhere classified: Secondary | ICD-10-CM

## 2019-12-02 DIAGNOSIS — B9681 Helicobacter pylori [H. pylori] as the cause of diseases classified elsewhere: Secondary | ICD-10-CM

## 2019-12-02 DIAGNOSIS — E559 Vitamin D deficiency, unspecified: Secondary | ICD-10-CM

## 2019-12-02 DIAGNOSIS — F17200 Nicotine dependence, unspecified, uncomplicated: Secondary | ICD-10-CM

## 2019-12-02 DIAGNOSIS — E538 Deficiency of other specified B group vitamins: Secondary | ICD-10-CM

## 2019-12-02 DIAGNOSIS — E786 Lipoprotein deficiency: Secondary | ICD-10-CM

## 2019-12-02 LAB — 9 DRUG TOXICOLOGY PANEL, URINE, W/ CONF.     (YH)
BKR AMPHETAMINE SCREEN, URINE, W/ CONF.: NEGATIVE
BKR BARBITURATE SCREEN, URINE, NO CONF.: NEGATIVE
BKR BENZODIAZEPINES SCREEN, URINE, NO CONF.: NEGATIVE
BKR CANNABINOIDS SCREEN, URINE, NO CONF.: NEGATIVE
BKR COCAINE SCREEN, URINE, W/ CONF.: NEGATIVE
BKR METHADONE METABOLITE SCREEN, URINE, W/ CONF.: NEGATIVE ug/dL — ABNORMAL HIGH (ref 37–145)
BKR OPIATES SCREEN, URINE, W/ CONF.: NEGATIVE mg/dL — ABNORMAL LOW (ref 8.8–10.2)
BKR OXYCODONE SCREEN, URINE, W/ CONF.: NEGATIVE
BKR PHENCYCLIDINE (PCP) SCREEN, URINE, W/ CONF.: NEGATIVE

## 2019-12-02 LAB — CBC WITH AUTO DIFFERENTIAL
BKR WAM ABSOLUTE IMMATURE GRANULOCYTES: 0 x 1000/??L (ref 0.0–0.3)
BKR WAM ABSOLUTE LYMPHOCYTE COUNT: 2.3 x 1000/??L (ref 1.0–4.0)
BKR WAM ABSOLUTE NRBC: 0 x 1000/ÂµL (ref 0.0–0.0)
BKR WAM ANALYZER ANC: 3.4 x 1000/??L (ref 1.0–11.0)
BKR WAM BASOPHIL ABSOLUTE COUNT: 0 x 1000/??L (ref 0.0–0.0)
BKR WAM BASOPHILS: 0.5 % (ref 0.0–4.0)
BKR WAM EOSINOPHIL ABSOLUTE COUNT: 0.2 x 1000/??L (ref 0.0–1.0)
BKR WAM EOSINOPHILS: 3 % (ref 0.0–7.0)
BKR WAM HEMATOCRIT: 36.5 % — ABNORMAL LOW (ref 37.0–52.0)
BKR WAM HEMOGLOBIN: 11.8 g/dL — ABNORMAL LOW (ref 12.0–18.0)
BKR WAM IMMATURE GRANULOCYTES: 0.2 % (ref 0.0–3.0)
BKR WAM LYMPHOCYTES: 36.3 % (ref 8.0–49.0)
BKR WAM MCH (PG): 25.4 pg — ABNORMAL LOW (ref 27.0–31.0)
BKR WAM MCHC: 32.3 g/dL (ref 31.0–36.0)
BKR WAM MCV: 78.5 fL (ref 78.0–94.0)
BKR WAM MONOCYTE ABSOLUTE COUNT: 0.3 x 1000/??L (ref 0.0–2.0)
BKR WAM MONOCYTES: 5.4 % (ref 4.0–15.0)
BKR WAM MPV: 8.9 fL (ref 6.0–11.0)
BKR WAM NEUTROPHILS: 54.6 % (ref 37.0–84.0)
BKR WAM NUCLEATED RED BLOOD CELLS: 0 % (ref 0.0–1.0)
BKR WAM PLATELETS: 349 x1000/ÂµL (ref 140–440)
BKR WAM RDW-CV: 16.1 % — ABNORMAL HIGH (ref 11.5–14.5)
BKR WAM RED BLOOD CELL COUNT: 4.7 M/ÂµL (ref 3.8–5.9)
BKR WAM WHITE BLOOD CELL COUNT: 6.3 x1000/??L (ref 4.0–10.0)

## 2019-12-02 LAB — TRIGLYCERIDES: BKR TRIGLYCERIDES: 74 mg/dL

## 2019-12-02 LAB — FERRITIN: BKR FERRITIN: 117 ng/mL (ref 13–150)

## 2019-12-02 LAB — IRON AND TIBC
BKR IRON SATURATION: 18 % (ref 15–50)
BKR IRON: 49 ug/dL (ref 37–145)
BKR TOTAL IRON BINDING CAPACITY: 266 ug/dL (ref 250–450)

## 2019-12-02 LAB — HDL CHOLESTEROL: BKR HDL CHOLESTEROL: 71 mg/dL (ref >=40)

## 2019-12-02 LAB — TSH: BKR THYROID STIMULATING HORMONE: 2.33 ??IU/mL

## 2019-12-02 LAB — PTH, INTACT     (BH GH LMW YH): BKR PARATHYROID HORMONE INTACT: 41.9 pg/mL (ref 15.0–65.0)

## 2019-12-02 LAB — CALCIUM: BKR CALCIUM: 9.9 mg/dL (ref 8.8–10.2)

## 2019-12-02 LAB — VITAMIN B12: BKR VITAMIN B12: 1365 pg/mL — ABNORMAL HIGH (ref 232–1245)

## 2019-12-02 LAB — FOLATE: BKR FOLATE: >20 ng/mL

## 2019-12-02 LAB — CHOLESTEROL, TOTAL: BKR CHOLESTEROL: 159 mg/dL

## 2019-12-03 ENCOUNTER — Telehealth: Admit: 2019-12-03 | Payer: PRIVATE HEALTH INSURANCE

## 2019-12-03 NOTE — Telephone Encounter
Left voicemail for patient to return my call regarding:In return of patient call

## 2019-12-04 LAB — NICOTINE AND COTININE, LC/MS/MS, URINE  (BH GH L Q YH)
COTININE, URINE: <2 ng/mL
NICOTINE, URINE: <2 ng/mL

## 2019-12-05 LAB — ZINC: ZINC: 65 ug/dL (ref 60–130)

## 2019-12-06 LAB — VITAMIN D, 25-HYDROXY
BKR VITAMIN D, 25-HYDROXY TOTAL (YH): 36 ng/mL
BKR VITAMIN D2, 25-HYDROXY: <5 ng/mL
BKR VITAMIN D3, 25-HYDROXY: 36 ng/mL

## 2019-12-07 LAB — VITAMIN B1, WHOLE BLOOD: VITAMIN B1, WHOLE BLOOD: 110 nmol/L (ref 78–185)

## 2019-12-09 ENCOUNTER — Telehealth: Admit: 2019-12-09 | Payer: PRIVATE HEALTH INSURANCE

## 2019-12-09 NOTE — Telephone Encounter
Left voicemail for patient to return my call regarding:Labs, final steps of bariatric process

## 2019-12-12 ENCOUNTER — Encounter: Admit: 2019-12-12 | Payer: PRIVATE HEALTH INSURANCE | Attending: Surgery

## 2019-12-13 ENCOUNTER — Encounter: Admit: 2019-12-13 | Payer: PRIVATE HEALTH INSURANCE

## 2019-12-13 ENCOUNTER — Telehealth: Admit: 2019-12-13 | Payer: PRIVATE HEALTH INSURANCE

## 2019-12-13 DIAGNOSIS — I4891 Unspecified atrial fibrillation: Secondary | ICD-10-CM

## 2019-12-13 NOTE — Telephone Encounter
Left voicemail for patient to return my call regarding:Return of patient messageand to discuss final steps required for the bariatric surgery process.430pm reviewed final stepsH Pylori labDupree APRN apptCardiac Clearance updatePatient verbalizes understanding

## 2019-12-15 ENCOUNTER — Telehealth: Admit: 2019-12-15 | Payer: PRIVATE HEALTH INSURANCE

## 2019-12-15 ENCOUNTER — Encounter: Admit: 2019-12-15 | Payer: PRIVATE HEALTH INSURANCE | Attending: Surgery

## 2019-12-15 NOTE — Telephone Encounter
Bariatric surgery processSpoke with patient regarding  LAB RESULTSRouted to primary MD Alphia Kava, DOH Pylori postponed s/p recent antibiotic usePatient verbalizes understanding

## 2019-12-16 NOTE — Telephone Encounter
This encounter was created in error - please disregard.

## 2019-12-21 ENCOUNTER — Encounter: Admit: 2019-12-21 | Payer: PRIVATE HEALTH INSURANCE | Attending: Family

## 2019-12-21 ENCOUNTER — Encounter: Admit: 2019-12-21 | Payer: MEDICAID | Attending: Family

## 2019-12-21 DIAGNOSIS — E611 Iron deficiency: Secondary | ICD-10-CM

## 2019-12-21 DIAGNOSIS — Z9989 Dependence on other enabling machines and devices: Secondary | ICD-10-CM

## 2019-12-21 DIAGNOSIS — E119 Type 2 diabetes mellitus without complications: Secondary | ICD-10-CM

## 2019-12-21 DIAGNOSIS — G4733 Obstructive sleep apnea (adult) (pediatric): Secondary | ICD-10-CM

## 2019-12-21 DIAGNOSIS — I4891 Unspecified atrial fibrillation: Secondary | ICD-10-CM

## 2019-12-21 DIAGNOSIS — I10 Essential (primary) hypertension: Secondary | ICD-10-CM

## 2019-12-21 DIAGNOSIS — G629 Polyneuropathy, unspecified: Secondary | ICD-10-CM

## 2019-12-21 DIAGNOSIS — E7800 Pure hypercholesterolemia, unspecified: Secondary | ICD-10-CM

## 2019-12-21 NOTE — Progress Notes
Bethany Wiggins returns for her bariatric follow up visit today to discuss weight loss medication options. The patient is currently going through the bariatric surgery pre-op process- interested in sleeve gastrectomy. She has gained some weight throughout the pre-op process. She was started on Ozempic to aid in pre-op weight loss- she reports decreased appetite. Drinking 48-64 ounces of water daily Started Ozempic 5/5/21Starting MWL weight 358lbProgram starting weight 336lb Diet recall is as follows:B: protein water OR oatmeal S: noneL: 2 oikos greek yogurt  S: noneD: vegetables and sometimes with baked or stewed chicken S: sugar free jello Today she weighs (!) 155.6 kg and her current Body mass index is 60.76 kg/m?Marland Kitchen Weight 343 lbs.She exercises never.Her dietary compliance is good.Alcohol Use: no  	Frequency: N/ADrug Use: no 		Frequency: N/ANicotine history:no	Packs per day:  N/ANSAID use: no 	Frequency: N/AOral/IV steroid use: no Frequency: N/ATaking vitamins?  yesIf yes, which vitamins are he or she taking?  Multivitamin, ironPHQ-2:Little interest or pleasure in doing things: Not at all Feeling down, depressed, or hopeless: Not at all PHQ-9 Total Score: 0Need for mental health referral: noCurrent Outpatient Medications on File Prior to Visit Medication Sig Dispense Refill ? allopurinoL (ZYLOPRIM) 100 mg tablet Take 1 tablet (100 mg total) by mouth daily. 90 tablet 1 ? aspirin 81 mg EC delayed release tablet Take 1 tablet (81 mg total) by mouth daily. 90 tablet 1 ? blood sugar diagnostic (FREESTYLE LITE) test strips FREESTYLE LITE TEST STRP   ? clindamycin (CLEOCIN T) 1 % external solution Apply topically 2 (two) times daily. 30 mL 0 ? clotrimazole-betamethasone (LOTRISONE) 1-0.05 % cream Apply topically 2 (two) times daily. 45 g 2 ? cyclobenzaprine (FLEXERIL) 5 mg tablet Take 1 tablet (5 mg total) by mouth 2 (two) times daily as needed for muscle spasms. 30 tablet 1 ? ferrous sulfate (FEOSOL) 325 mg (65 mg iron) tablet Take 1 tablet by mouth daily.   ? fluticasone propionate (FLONASE) 50 mcg/actuation nasal spray Use 2 sprays in each nostril daily. 16 g 1 ? furosemide (LASIX) 20 mg tablet Take 1 tablet (20 mg total) by mouth daily. For leg swelling 30 tablet 2 ? gabapentin (NEURONTIN) 600 mg tablet Take 1 tablet (600 mg total) by mouth 3 (three) times daily with meals. 90 tablet 1 ? ketamine/doxepin/nifedipine/pentoxifylline: 10-5-8-8% topical (compound) Apply topically up to four times daily as needed to feet for symptoms of burning, electricity, and tingling. 60 g 5 ? ketamine/doxepin/nifedipine/pentoxifylline: 10-5-8-8% topical (compound) Apply topically up to four times daily as needed to feet for symptoms of burning, electricity, and tingling. 60 g 5 ? Lactobacillus acidophilus (PROBIOTIC ORAL) Take by mouth.   ? lancets (RELIAMED LANCET) 30 gauge Misc lancets LANCETS ULTRA THIN 30G   ? levocetirizine (XYZAL) 5 mg tablet Take 1 tablet (5 mg total) by mouth every evening before dinner. 90 tablet 1 ? lidocaine (LIDODERM) 5 % Place 2-3 patches onto the skin daily. Remove & Discard patch within 12 hours or as directed by MD 90 patch 3 ? metFORMIN (GLUCOPHAGE) 500 mg Immediate Release tablet Take 1 tablet (500 mg total) by mouth 2 (two) times daily. 180 tablet 1 ? Miscellaneous Medical Supply Please send 4 legged cane, for stability and chronic knee pain, Dx M25.561 1 each 0 ? multivitamin (MULTIVITAMIN) tablet Take 1 tablet by mouth daily.   ? rosuvastatin (CRESTOR) 20 mg tablet Take 1 tablet (20 mg total) by mouth daily. 90 tablet 1 ? semaglutide (OZEMPIC) 1 mg/dose (2 mg/1.5 mL) Pen Injector Inject 1 mg under the  skin once a week. 9 mL 0 ? triamterene-hydroCHLOROthiazide (DYAZIDE) 37.5-25 mg per capsule Take 1 capsule by mouth every morning. 90 capsule 1 No current facility-administered medications on file prior to visit.  Physical Exam: Ht 5' 3 (1.6 m)  - Wt (!) 155.6 kg  - BMI 60.76 kg/m? General:  Alert, oriented, no acute distress Recent bariatric labs reviewed: noBariatric labs ordered today: noMs. Wiggins is currrently going through the pre-op process for bariatric surgery and would benefit from pre-op weight loss.Marland Kitchen Has been working with dietician and has made some changes to diet, but exercise limited due to knee pain. She is currently taking Ozempic to aid in weight management- doing well overall. She has lost 15 lbs since starting Ozempic- encouraged to continue with good dietary habits and increase exercise. Reminded patient this medication will reduce appetite and will help with overall caloric intake. However, this must be combined with healthy dietary intake and at least 30 minutes of exercise four times a week. Plan to continue Ozempic 1 mg weekly. Will return to see me in 8 weeks. Patient amenable to plan.My recommendations for Bethany Wiggins ZOX:[id number] exerciseContinue with good dietary habits- increase hydrationNutrition consultTake Multivitamin and Calcium dailyContinue Ozempic 1 mg weeklyMs. Wiggins will follow up in 8 weeks.  VIDEO TELEHEALTH VISIT: This clinician is part of the telehealth program and is conducting this visit in a currently approved location. For this visit the clinician and patient were present via interactive audio & video telecommunications system that permits real-time communications.Consent was signed via the Patient Acknowledgement and Financial Authorization Form and the Ambulatory Telehealth Consent Form. State patient is located in: [place] clinician is appropriately licensed in the above state to provide care for this visit. Other individuals present during the telehealth encounter and their role/relation: noneIf billing based on time, please complete (Not required if billing based on MDM):                           Total time spent in medical video consultation:  30 minutes; Total time spent by the provider on the day of service, which includes time spent on chart review, medical video consultation, education, coordination of care/services and counseling Because this visit was completed over video, a hands-on physical exam was not performed.  Patient/parent or guardian understands and knows to call back if condition changes.

## 2019-12-22 ENCOUNTER — Encounter: Admit: 2019-12-22 | Payer: PRIVATE HEALTH INSURANCE | Attending: Surgery

## 2019-12-22 ENCOUNTER — Encounter: Admit: 2019-12-22 | Payer: PRIVATE HEALTH INSURANCE

## 2019-12-29 ENCOUNTER — Inpatient Hospital Stay: Admit: 2019-12-29 | Discharge: 2019-12-29 | Payer: MEDICAID

## 2019-12-29 DIAGNOSIS — E119 Type 2 diabetes mellitus without complications: Secondary | ICD-10-CM

## 2019-12-29 DIAGNOSIS — E8809 Other disorders of plasma-protein metabolism, not elsewhere classified: Secondary | ICD-10-CM

## 2019-12-29 DIAGNOSIS — F17200 Nicotine dependence, unspecified, uncomplicated: Secondary | ICD-10-CM

## 2019-12-29 DIAGNOSIS — K912 Postsurgical malabsorption, not elsewhere classified: Secondary | ICD-10-CM

## 2019-12-29 DIAGNOSIS — E786 Lipoprotein deficiency: Secondary | ICD-10-CM

## 2019-12-29 DIAGNOSIS — E519 Thiamine deficiency, unspecified: Secondary | ICD-10-CM

## 2019-12-29 DIAGNOSIS — B9681 Helicobacter pylori [H. pylori] as the cause of diseases classified elsewhere: Secondary | ICD-10-CM

## 2019-12-29 DIAGNOSIS — E213 Hyperparathyroidism, unspecified: Secondary | ICD-10-CM

## 2019-12-29 DIAGNOSIS — K2970 Gastritis, unspecified, without bleeding: Secondary | ICD-10-CM

## 2019-12-29 DIAGNOSIS — E039 Hypothyroidism, unspecified: Secondary | ICD-10-CM

## 2019-12-29 DIAGNOSIS — E559 Vitamin D deficiency, unspecified: Secondary | ICD-10-CM

## 2019-12-29 DIAGNOSIS — E538 Deficiency of other specified B group vitamins: Secondary | ICD-10-CM

## 2019-12-29 DIAGNOSIS — E611 Iron deficiency: Secondary | ICD-10-CM

## 2019-12-29 DIAGNOSIS — K909 Intestinal malabsorption, unspecified: Secondary | ICD-10-CM

## 2019-12-29 DIAGNOSIS — Z0189 Encounter for other specified special examinations: Secondary | ICD-10-CM

## 2019-12-29 LAB — H. PYLORI BREATH TEST: BKR H. PYLORI BREATH TEST: NEGATIVE x 1000/??L (ref 0.0–0.0)

## 2019-12-31 ENCOUNTER — Encounter: Admit: 2019-12-31 | Payer: MEDICAID | Attending: Psychiatry

## 2020-01-12 ENCOUNTER — Telehealth: Admit: 2020-01-12 | Payer: PRIVATE HEALTH INSURANCE

## 2020-01-12 NOTE — Telephone Encounter
Bariatric surgery processSpoke with patient regarding  final required steps of bariatric processPsych Assessment update, appt 10/11/21Cardiology Clearance update, appt 10/4/21Patient verbalizes understanding

## 2020-01-17 ENCOUNTER — Ambulatory Visit: Admit: 2020-01-17 | Payer: MEDICAID | Attending: Student in an Organized Health Care Education/Training Program

## 2020-01-17 ENCOUNTER — Encounter
Admit: 2020-01-17 | Payer: PRIVATE HEALTH INSURANCE | Attending: Student in an Organized Health Care Education/Training Program

## 2020-01-17 DIAGNOSIS — E119 Type 2 diabetes mellitus without complications: Secondary | ICD-10-CM

## 2020-01-17 DIAGNOSIS — E7800 Pure hypercholesterolemia, unspecified: Secondary | ICD-10-CM

## 2020-01-17 DIAGNOSIS — Z9989 Dependence on other enabling machines and devices: Secondary | ICD-10-CM

## 2020-01-17 DIAGNOSIS — G629 Polyneuropathy, unspecified: Secondary | ICD-10-CM

## 2020-01-17 DIAGNOSIS — Z01810 Encounter for preprocedural cardiovascular examination: Secondary | ICD-10-CM

## 2020-01-17 DIAGNOSIS — E611 Iron deficiency: Secondary | ICD-10-CM

## 2020-01-17 DIAGNOSIS — I10 Essential (primary) hypertension: Secondary | ICD-10-CM

## 2020-01-17 DIAGNOSIS — I4891 Unspecified atrial fibrillation: Secondary | ICD-10-CM

## 2020-01-17 DIAGNOSIS — G4733 Obstructive sleep apnea (adult) (pediatric): Secondary | ICD-10-CM

## 2020-01-17 NOTE — Progress Notes
Patient was identified as a fall risk.  Universal Fall precautions protocol maintained during vist; Room was free of clutter, personal items were within reach, and verification that all needs were met prior to leaving the examination room.  Additional Universal Fall Interventions included: Patient informed to remain in chair until clinician is present.Encouraged patient to use their appropriate assistive devices.Family members were encouraged to be with patient.When patient properly clothed and practical, examination room door was left open. Fall risk was identified in EMR with fall risk banner. Unit specific identifiers were applied - including sign outside of room and yellow fall risk bracelet.Fall risk brochures were available.

## 2020-01-17 NOTE — Progress Notes
Reason for Visit: Bariatric surgery pre-op    History of Present Illness   59F PMHx HTN, HLD, T2DM (A1c=6.5), OSA on CPAP, remote Af, BMI 60 who comes today for updates pre-op evaluation for bariatric surgery (Patient was seen by both Dr Red Christians (11/2018) and Dr Remus Loffler (06/2019) for the same)    As noted previously she continues to have good functional capacity, and previous TTE 10/2017 with normal R/L function (EF 55-60%) and no valvular abnormalities, and PET 10/2018 obtained for atypical CP, which was normal with no coroanry calcifications      She returns for pre-operative assessment   She has no CP, SOB, palpitations, lightheadedness, dizziness, PND, orthopnea   She can climb 2 flights of stairs without CP      Patient has history of one time episode of symptomatic AFib w/ RVR which occurred in 2006 when she was in [place] on a very hot day and was using cocaine and spontaneously converted and was previously on coumadin, which she self discontinued due to easy bruising and heavy periods >59yrs ago.  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 TTE with no LA dilation. She was symptoamtic w/ palpitations at that time. She has since had an event monitor without a. Fib in 2019.     ECG in clinic show NSR unchanged form priors       Past Medical History     The imported medical history below has been reviewed:  Past Medical History:   Diagnosis Date   ? A-fib (HC Code)    ? CPAP (continuous positive airway pressure) dependence     has c-pap; doesnt use it ;  too much pressure   ? Diabetes mellitus (HC Code)    ? High cholesterol    ? Hypertension    ? Iron deficiency    ? Neuropathy (HC Code)    ? Obstructive sleep apnea          Past Surgical History     Past Surgical History:   Procedure Laterality Date   ? DENTAL SURGERY     ? SPLIT SLEEP STUDY  03/16/2018         Allergies     Allergies   Allergen Reactions   ? Lisinopril Angioedema         Medications:     Outpatient Medications Marked as Taking for the 01/17/20 encounter (Office Visit) with Meriel Flavors, MD   Medication Sig Dispense Refill   ? allopurinoL (ZYLOPRIM) 100 mg tablet Take 1 tablet (100 mg total) by mouth daily. 90 tablet 1   ? aspirin 81 mg EC delayed release tablet Take 1 tablet (81 mg total) by mouth daily. 90 tablet 1   ? blood sugar diagnostic (FREESTYLE LITE) test strips FREESTYLE LITE TEST STRP     ? clindamycin (CLEOCIN T) 1 % external solution Apply topically 2 (two) times daily. 30 mL 0   ? clotrimazole-betamethasone (LOTRISONE) 1-0.05 % cream Apply topically 2 (two) times daily. 45 g 2   ? cyclobenzaprine (FLEXERIL) 5 mg tablet Take 1 tablet (5 mg total) by mouth 2 (two) times daily as needed for muscle spasms. 30 tablet 1   ? ferrous sulfate (FEOSOL) 325 mg (65 mg iron) tablet Take 1 tablet by mouth daily.     ? furosemide (LASIX) 20 mg tablet Take 1 tablet (20 mg total) by mouth daily. For leg swelling 90 tablet 1   ? gabapentin (NEURONTIN) 600 mg tablet Take 1 tablet (  600 mg total) by mouth 3 (three) times daily with meals. 90 tablet 1   ? Lactobacillus acidophilus (PROBIOTIC ORAL) Take by mouth daily.      ? lancets (RELIAMED LANCET) 30 gauge Misc lancets LANCETS ULTRA THIN 30G     ? levocetirizine (XYZAL) 5 mg tablet Take 1 tablet (5 mg total) by mouth every evening before dinner. 90 tablet 1   ? lidocaine (LIDODERM) 5 % Place 2-3 patches onto the skin daily. Remove & Discard patch within 12 hours or as directed by MD 90 patch 3   ? metFORMIN (GLUCOPHAGE) 500 mg Immediate Release tablet Take 1 tablet (500 mg total) by mouth 2 (two) times daily. 180 tablet 1   ? Miscellaneous Medical Supply Please send 4 legged cane, for stability and chronic knee pain, Dx M25.561 1 each 0   ? multivitamin (MULTIVITAMIN) tablet Take 1 tablet by mouth daily.     ? rosuvastatin (CRESTOR) 20 mg tablet Take 1 tablet (20 mg total) by mouth daily. 90 tablet 1   ? semaglutide (OZEMPIC) 1 mg/dose (2 mg/1.5 mL) Pen Injector Inject 1 mg under the skin once a week. 9 mL 0   ? triamterene-hydroCHLOROthiazide (DYAZIDE) 37.5-25 mg per capsule Take 1 capsule by mouth every morning. 90 capsule 1       Social History     Social History     Tobacco Use   ? Smoking status: Former Smoker     Packs/day: 0.10     Years: 40.00     Pack years: 4.00     Types: Cigarettes     Quit date: 10/16/2018     Years since quitting: 1.2   ? Smokeless tobacco: Never Used   ? Tobacco comment: 1/2-1ppd since age 7; quit 10/2018 w/ some relapses   Vaping Use   ? Vaping Use: Never used   Substance Use Topics   ? Alcohol use: No     Comment: alcohol abuse w/ cocaine use in past, but not currently   ? Drug use: Not Currently     Types: Cocaine     Comment: long time hx of cocaine abuse'/ not in past year        Family History     Family Status   Relation Name Status   ? Father  (Not Specified)   ? Sister  (Not Specified)   ? Emelda Brothers  (Not Specified)   ? Oneal Grout  (Not Specified)   ? Neg Hx  (Not Specified)        Review of Systems     General: No Weight Loss, No Weight Gain, No Fever, No Chills, No Visual Changes, No Memory Changes  Cardiac: No LE Edema, No Palpitations, No Syncope, No PND, No Orthopnea  GI: No Nausea, No Vomiting, No Abdominal Pain, No Heartburn; No melena, No BRBPR, No Diarrhea  Pulmonary: No Cough, No Wheezing  Urological: No Hematuria, No Dysuria  Neurological: No Weakness, No Tremor  Dermatologic No Rash, No Itching  Rheumatologic: No Joint Pain, No Joint Swelling.  (All other systems were reviewed and negative).    PHYSICAL EXAM    VS:  BP: 118/86, Pulse: 82, Temp: 97 ?F (36.1 ?C), SpO2: 96 %, Pain Score: Zero, Height: 5' 3 (1.6 m), Weight: (!) 160.1 kg, Weight (lbs): 353, BMI (Calculated): 62.7   Vitals:    01/17/20 0942 01/17/20 0943   BP: 114/84 118/86   Site: Left Arm Right Arm   Position: Sitting Sitting  Pulse: 82    Temp: 97 ?F (36.1 ?C)    SpO2: 96%    Weight: (!) 160.1 kg    Height: 5' 3 (1.6 m)         Wt Readings from Last 3 Encounters:   01/17/20 (!) 160.1 kg   12/21/19 (!) 155.6 kg   11/30/19 (!) 155.6 kg       General: No Acute Distress, obese  HEENT: EOMI, MMM  Neck: Supple, No visible JVD, No Carotid Bruit   Pulmonary: CTAB; Normal effort; No wheezes/rhonchi/rales  Cardiac: normal S1, normal S2, RRR, no M/R/G  Abdomen: Soft, non-tender, non-distended, no guarding or rebound; NABS  Neuro: CN grossly intact; Moves all 4 extremities  Psychiatric: Appropriate, Alert and Oriented x3  Extremities: No Clubbing, Cyanosis, or Edema, 2+ pulses, bilaterally, throughout.    Diagnostics     ECG: NSR    Results for orders placed or performed in visit on 01/17/20   EKG (Clinic Performed)   Result Value Ref Range    ECG - HEART RATE 82 bpm    ECG - QRS Interval 98 ms    ECG - QT Interval 372 ms    ECG - QTC Interval 436 ms    ECG - P Axis 17 deg    ECG - QRS Axis -4 deg    ECG - T Wave Axis 31 deg    ECG -- P-R Interval 169 msec    ECG - SEVERITY Normal ECG severity       Results for orders placed or performed during the hospital encounter of 07/09/17   Echo 2D Complete w Doppler and CFI if Ind Bubbles Image Enhancement and or 3D   Result Value Ref Range    Reported Visual Range EF% 55-60 %    Narrative     * Technically difficult study.  * Normal left ventricular size, thickness, systolic function and wall motion. LVEF estimated by visual assessment was between 55-60%.  Normal diastolic function and filling pressures.  * Normal right ventricular cavity size and systolic function.  Estimated right ventricular systolic pressure is 30 mmHg.  * Atria are normal in size.  * No significant valvular abnormalities.  * No evidence of pericardial effusion.  * Compared with the prior study, dated 11/14/2009, there are changes noted.  Mitral regurgitation no longer appreciated though technical differences preclude complete comparison.       No results found for this or any previous visit.    PET [place] 10/2018:   * Normal PET myocardial perfusion study following pharmacologic vasodilation with regadenoson and at rest.  Perfusion imaging was normal.  Left ventricular ejection fraction was normal with normal wall motion.  The left ventricle was normal in size.  Stress electrocardiogram was normal.  [place] performed for attenuation correction showed no coronary artery calcifications.  Non-cardiac findings of the [place] are described in a separate report from Radiology.  No prior study available for comparison.  * Global myocardial flow reserve was normal at 4.24.       Labs     Chemistry    Lab Results   Component Value Date    NA 138 11/30/2019    K 3.7 11/30/2019    CL 94 (L) 11/30/2019    CO2 31 (H) 11/30/2019    BUN 11 11/30/2019    CREATININE 0.80 11/30/2019    CALCIUM 9.9 12/02/2019       Lab Results   Component Value Date    ALT  14 01/25/2019    AST 14 01/25/2019    ALKPHOS 55 01/25/2019    BILITOT 0.3 01/25/2019       Lab Results   Component Value Date    BNPPRO 58.0 10/25/2018       CBC    Lab Results   Component Value Date    WBC 6.7 12/22/2019    RBC 4.59 12/22/2019    HGB 11.6 (L) 12/22/2019    HCT 37.7 12/22/2019    MCV 82.1 12/22/2019    MCH 25.3 (L) 12/22/2019    MCHC 30.8 (L) 12/22/2019    RDW 16.2 (H) 12/22/2019    PLT 364 12/22/2019    MPV 9.2 12/22/2019          Lipids    Lab Results   Component Value Date    CHOL 159 12/02/2019    CHOL 164 12/02/2018    HDL 71 12/02/2019    HDL 75 12/02/2018    LDL 74 11/30/2019    LDL 86 12/02/2018    TRIG 74 12/02/2019    TRIG 81 12/02/2018         A1C    Lab Results   Component Value Date    HGBA1C 6.5 (H) 11/30/2019    HGBA1C 6.6 10/06/2019    HGBA1C 6.4 (H) 01/25/2019       Thyroid  Lab Results   Component Value Date    TSH 2.330 12/02/2019       Impression/Plan   54F PMHx HTN, HLD, T2DM (A1c=6.5), OSA on CPAP, remote Af, BMI 60 who comes today for updates pre-op evaluation for bariatric surgery (Patient was seen by both Dr Red Christians (11/2018) and Dr Remus Loffler (06/2019) for the same)    She is unchanged from last appointment     Plan:  She is low risk for low-intermediate risk surgery.  RCRI 0.   Can proceed with surgery without furher cardiac testing or intervention   Can hold ASA prior to surgery from a cardiac prospective  Should hold metformin prior to surgery      She has a history of a. Fib remotely while she was using crack/cocaine (2006).  She was symptoamtic with palpitations at that time.  She has since had multiple ECGs and an event monitor w/o a. Fib.  She has been off coumadin for more than 37yrs.  Discussed w/ patient if to develop palpitations to let us know and we can set up an event monitor.  Her LA is normal size.  It may have been provoked a. Fib in the setting of drug use.  Will monitor for now    Follow-up: PRN    Willy Eddy, MD  Cardiology Fellow  [facility] [phone]  01/17/20 9:27 AM    Cardiology Attending:   I have personally seen and evaluated Ms. Biever with the cardiovascular medicine fellow - Dr. Maisie Fus, reviewed her medical history and clinical data, and have discussed with Dr. Maisie Fus. I agree with his excellent assessment and recommendations, which reflect our joint evaluation and discussion.    Ms. Cordial had a stress-PET study that demonstrated no focal perfusion defects, normal global myocardial perfusion, and no coronary calcium on a concomitant Aristes. She had previous PAF, but this was in the context of substance abuse, from which she is abstaining. She is very encouraged about her pending bariatric surgery, states that she is committed to altering her diet and working hard to consolidate the results of this surgery, and from a cardiovascular perspective  she is low risk for this procedure and can proceed without further testing.    Cleora Fleet, M.D.  Associate Professor of Medicine  [facility] Interventional Cardiology

## 2020-01-18 ENCOUNTER — Telehealth: Admit: 2020-01-18 | Payer: PRIVATE HEALTH INSURANCE

## 2020-01-18 NOTE — Telephone Encounter
Bariatric surgery processSpoke with patient regarding  final required steps of bariatric processPsych Assessment follow up appointment

## 2020-01-24 ENCOUNTER — Encounter: Admit: 2020-01-24 | Payer: MEDICAID | Attending: Psychiatry

## 2020-01-24 ENCOUNTER — Encounter: Admit: 2020-01-24 | Payer: PRIVATE HEALTH INSURANCE | Attending: Psychiatry

## 2020-01-24 DIAGNOSIS — Z9989 Dependence on other enabling machines and devices: Secondary | ICD-10-CM

## 2020-01-24 DIAGNOSIS — I10 Essential (primary) hypertension: Secondary | ICD-10-CM

## 2020-01-24 DIAGNOSIS — I4891 Unspecified atrial fibrillation: Secondary | ICD-10-CM

## 2020-01-24 DIAGNOSIS — E611 Iron deficiency: Secondary | ICD-10-CM

## 2020-01-24 DIAGNOSIS — Z01818 Encounter for other preprocedural examination: Secondary | ICD-10-CM

## 2020-01-24 DIAGNOSIS — E7800 Pure hypercholesterolemia, unspecified: Secondary | ICD-10-CM

## 2020-01-24 DIAGNOSIS — G4733 Obstructive sleep apnea (adult) (pediatric): Secondary | ICD-10-CM

## 2020-01-24 DIAGNOSIS — G629 Polyneuropathy, unspecified: Secondary | ICD-10-CM

## 2020-01-24 DIAGNOSIS — E119 Type 2 diabetes mellitus without complications: Secondary | ICD-10-CM
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
## 2020-01-25 ENCOUNTER — Telehealth: Admit: 2020-01-25 | Payer: PRIVATE HEALTH INSURANCE

## 2020-01-25 ENCOUNTER — Encounter: Admit: 2020-01-25 | Payer: PRIVATE HEALTH INSURANCE

## 2020-01-25 NOTE — Telephone Encounter
Left voicemail for patient to return my call regarding:  Chart reviewed, ready to submit to insurance.

## 2020-01-26 ENCOUNTER — Telehealth: Admit: 2020-01-26 | Payer: PRIVATE HEALTH INSURANCE

## 2020-01-26 NOTE — Telephone Encounter
Bariatric surgery processSpoke with patient regarding  final  steps of bariatric processChart reviewed, ready to submit to insurance. Patient verbalizes understanding

## 2020-02-21 ENCOUNTER — Encounter: Admit: 2020-02-21 | Payer: MEDICAID | Attending: Family

## 2020-03-03 ENCOUNTER — Encounter: Admit: 2020-03-03 | Payer: PRIVATE HEALTH INSURANCE | Attending: Family

## 2020-03-03 ENCOUNTER — Ambulatory Visit: Admit: 2020-03-03 | Payer: PRIVATE HEALTH INSURANCE | Attending: Family

## 2020-03-03 DIAGNOSIS — G629 Polyneuropathy, unspecified: Secondary | ICD-10-CM

## 2020-03-03 DIAGNOSIS — E611 Iron deficiency: Secondary | ICD-10-CM

## 2020-03-03 DIAGNOSIS — I4891 Unspecified atrial fibrillation: Secondary | ICD-10-CM

## 2020-03-03 DIAGNOSIS — G4733 Obstructive sleep apnea (adult) (pediatric): Secondary | ICD-10-CM

## 2020-03-03 DIAGNOSIS — E119 Type 2 diabetes mellitus without complications: Secondary | ICD-10-CM

## 2020-03-03 DIAGNOSIS — E7800 Pure hypercholesterolemia, unspecified: Secondary | ICD-10-CM

## 2020-03-03 DIAGNOSIS — I10 Essential (primary) hypertension: Secondary | ICD-10-CM

## 2020-03-03 DIAGNOSIS — Z9989 Dependence on other enabling machines and devices: Secondary | ICD-10-CM

## 2020-03-03 NOTE — Progress Notes
Bethany Wiggins returns for her bariatric follow up visit today to discuss weight loss medication options. The patient is currently going through the bariatric surgery pre-op process- interested in sleeve gastrectomy. She has gained some weight throughout the pre-op process. She was started on Ozempic to aid in pre-op weight loss- she reports decreased appetite. Currently taking 1 mg weekly. She has lost 11 lbs since starting Ozempic (per patient weight fluctuates- was 4 lbs less last time we met). Drinking 60-90 ounces of water daily Started Ozempic 5/5/21Starting [facility] weight 358lbProgram starting weight 336lb Diet recall is as follows:B: coffee with splenda and 2% milk S: noneL: lentil soup with unsalted crackersS: noneD: salad with occasional deli meat Today she weighs (!) 157.4 kg and her current Body mass index is 61.47 kg/m?Marland Kitchen Weight 347 lbs.She exercises 4 times a month- goes to gym Her dietary compliance is fair.Alcohol Use: no  	Frequency: N/ADrug Use: no 		Frequency: N/ANicotine history:no	Packs per day:  N/ANSAID use: no 	Frequency: N/AOral/IV steroid use: no Frequency: N/ATaking vitamins?  yesIf yes, which vitamins are he or she taking?  Multivitamin, ironPHQ-2:Little interest or pleasure in doing things: Not at all Feeling down, depressed, or hopeless: Not at all PHQ-9 Total Score: 0Need for mental health referral: noCurrent Outpatient Medications on File Prior to Visit Medication Sig Dispense Refill ? allopurinoL (ZYLOPRIM) 100 mg tablet Take 1 tablet (100 mg total) by mouth daily. 90 tablet 1 ? aspirin 81 mg EC delayed release tablet Take 1 tablet (81 mg total) by mouth daily. 90 tablet 1 ? blood sugar diagnostic (FREESTYLE LITE) test strips FREESTYLE LITE TEST STRP   ? clindamycin (CLEOCIN T) 1 % external solution Apply topically 2 (two) times daily. 30 mL 0 ? clotrimazole-betamethasone (LOTRISONE) 1-0.05 % cream Apply topically 2 (two) times daily. 45 g 2 ? cyclobenzaprine (FLEXERIL) 5 mg tablet Take 1 tablet (5 mg total) by mouth 2 (two) times daily as needed for muscle spasms. 30 tablet 1 ? ferrous sulfate (FEOSOL) 325 mg (65 mg iron) tablet Take 1 tablet by mouth daily.   ? fluticasone propionate (FLONASE) 50 mcg/actuation nasal spray Use 2 sprays in each nostril daily. 16 g 1 ? furosemide (LASIX) 20 mg tablet Take 1 tablet (20 mg total) by mouth daily. For leg swelling 90 tablet 1 ? gabapentin (NEURONTIN) 600 mg tablet Take 1 tablet (600 mg total) by mouth 3 (three) times daily with meals. 90 tablet 2 ? Lactobacillus acidophilus (PROBIOTIC ORAL) Take by mouth daily.    ? lancets (RELIAMED LANCET) 30 gauge Misc lancets LANCETS ULTRA THIN 30G   ? levocetirizine (XYZAL) 5 mg tablet Take 1 tablet (5 mg total) by mouth every evening before dinner. 90 tablet 1 ? lidocaine (LIDODERM) 5 % Place 2-3 patches onto the skin daily. Remove & Discard patch within 12 hours or as directed by MD 90 patch 3 ? metFORMIN (GLUCOPHAGE) 500 mg Immediate Release tablet Take 1 tablet (500 mg total) by mouth 2 (two) times daily. 180 tablet 1 ? Miscellaneous Medical Supply Please send 4 legged cane, for stability and chronic knee pain, Dx M25.561 1 each 0 ? multivitamin (MULTIVITAMIN) tablet Take 1 tablet by mouth daily.   ? rosuvastatin (CRESTOR) 20 mg tablet Take 1 tablet (20 mg total) by mouth daily. 90 tablet 1 ? semaglutide (OZEMPIC) 1 mg/dose (2 mg/1.5 mL) Pen Injector Inject 1 mg under the skin once a week. 9 mL 0 ? triamterene-hydroCHLOROthiazide (DYAZIDE) 37.5-25 mg per capsule Take 1 capsule by mouth every morning. 90 capsule 1 ?  ketamine/doxepin/nifedipine/pentoxifylline: 10-5-8-8% topical (compound) Apply topically up to four times daily as needed to feet for symptoms of burning, electricity, and tingling. (Patient not taking: Reported on 01/17/2020) 60 g 5 ? ketamine/doxepin/nifedipine/pentoxifylline: 10-5-8-8% topical (compound) Apply topically up to four times daily as needed to feet for symptoms of burning, electricity, and tingling. (Patient not taking: Reported on 01/17/2020) 60 g 5 No current facility-administered medications on file prior to visit.  Physical Exam: Ht 5' 3 (1.6 m)  - Wt (!) 157.4 kg  - BMI 61.47 kg/m? General:  Alert, oriented, no acute distress Recent bariatric labs reviewed: noBariatric labs ordered today: noMs. Wiggins is currrently going through the pre-op process for bariatric surgery and would benefit from pre-op weight loss. She has gained some weight during the process. She was started on Ozempic to aid in pre-op weight loss- she reports decreased appetite. Currently taking 1 mg weekly. She has lost 11 lbs since starting Ozempic (per patient weight fluctuates- was 4 lbs less last time we met). Reviewed with patient importance of consistency with improved dietary habits and regular exercise. She has a Photographer but has not been going regularly as she prefers to workout with a partner- encouraged to use distraction such as music to stay motivated. She is inconsistent with diet- I am recommending additional dietician visits. She is currently scheduled for surgery 05/11/20, but discussed if she continues to be inconsistent with diet/exercise and she does not meet pre-op gaol this may need to be postponed. Discussed this with navigator, who will closely follow-up with patient and assess progress- will schedule in person height/weight. Will continue Ozempic 1 mg weekly- Reminded patient this medication will reduce appetite and will help with overall caloric intake. However, this must be combined with healthy dietary intake and at least 30 minutes of exercise four times a week. Will return to see me in 6 weeks. Patient amenable to plan.My recommendations for Bethany Wiggins WGN:FAOZHYQM exerciseResume good dietary habitsNutrition consultTake Multivitamin and Calcium dailyContinue Ozempic 1 mg weeklyIn person height/weightPre-op weight goal <330lbs.Bethany Wiggins will follow up in 6 weeks.  VIDEO TELEHEALTH VISIT: This clinician is part of the telehealth program and is conducting this visit in a currently approved location. For this visit the clinician and patient were present via interactive audio & video telecommunications system that permits real-time communications.Consent was signed via the Patient Acknowledgement and Financial Authorization Form and the Ambulatory Telehealth Consent Form. State patient is located in: [place] clinician is appropriately licensed in the above state to provide care for this visit. Other individuals present during the telehealth encounter and their role/relation: noneIf billing based on time, please complete (Not required if billing based on MDM):                           Total time spent in medical video consultation: 30 minutes; Total time spent by the provider on the day of service, which includes time spent on chart review, medical video consultation, education, coordination of care/services and counseling Because this visit was completed over video, a hands-on physical exam was not performed.  Patient/parent or guardian understands and knows to call back if condition changes.

## 2020-03-06 ENCOUNTER — Telehealth: Admit: 2020-03-06 | Payer: PRIVATE HEALTH INSURANCE

## 2020-03-06 NOTE — Telephone Encounter
Left voicemail for patient to return my call regarding:Nutrition appt for pre op support

## 2020-03-08 ENCOUNTER — Telehealth: Admit: 2020-03-08 | Payer: PRIVATE HEALTH INSURANCE

## 2020-03-08 NOTE — Telephone Encounter
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

## 2020-03-15 ENCOUNTER — Emergency Department: Admit: 2020-03-15 | Payer: PRIVATE HEALTH INSURANCE

## 2020-03-15 ENCOUNTER — Telehealth: Admit: 2020-03-15 | Payer: PRIVATE HEALTH INSURANCE

## 2020-03-15 ENCOUNTER — Inpatient Hospital Stay: Admit: 2020-03-15 | Discharge: 2020-03-15 | Payer: MEDICAID

## 2020-03-15 DIAGNOSIS — Z7951 Long term (current) use of inhaled steroids: Secondary | ICD-10-CM

## 2020-03-15 DIAGNOSIS — Z9989 Dependence on other enabling machines and devices: Secondary | ICD-10-CM

## 2020-03-15 DIAGNOSIS — M7989 Other specified soft tissue disorders: Secondary | ICD-10-CM

## 2020-03-15 DIAGNOSIS — I10 Essential (primary) hypertension: Secondary | ICD-10-CM

## 2020-03-15 DIAGNOSIS — E7800 Pure hypercholesterolemia, unspecified: Secondary | ICD-10-CM

## 2020-03-15 DIAGNOSIS — M25511 Pain in right shoulder: Secondary | ICD-10-CM

## 2020-03-15 DIAGNOSIS — M25562 Pain in left knee: Secondary | ICD-10-CM

## 2020-03-15 DIAGNOSIS — G4733 Obstructive sleep apnea (adult) (pediatric): Secondary | ICD-10-CM

## 2020-03-15 DIAGNOSIS — E1140 Type 2 diabetes mellitus with diabetic neuropathy, unspecified: Secondary | ICD-10-CM

## 2020-03-15 DIAGNOSIS — Z7982 Long term (current) use of aspirin: Secondary | ICD-10-CM

## 2020-03-15 DIAGNOSIS — Z888 Allergy status to other drugs, medicaments and biological substances status: Secondary | ICD-10-CM

## 2020-03-15 DIAGNOSIS — Z87891 Personal history of nicotine dependence: Secondary | ICD-10-CM

## 2020-03-15 DIAGNOSIS — Z79899 Other long term (current) drug therapy: Secondary | ICD-10-CM

## 2020-03-15 NOTE — ED Triage Note
Provider in Triage Note65 y.o. year old female  presents with fall last night; right shoulder pain and left leg pain Physical Exam:Alert. No acute distressThere were no vitals filed for this visit. Orders placed in triage: yesDisposition: Express Care for further evaluation.Bethany Marble Tonder12/04/2019 3:44 PM

## 2020-03-15 NOTE — Discharge Instructions
Please follow up with your doctor as needed. The xray did not show any evidence of a fracture.  Administer Tylenol over the counter as directed when needed for pain, always take with food, never on an empty stomach.  Keep the extremity elevated to help with swelling.  Ice in 15-20 minute intervals to help with discomfort.  If there is increased pain, swelling, numbness, tingling, color or temperature changes to the toes/fingers or with any other concerns such as chest pain or short of breath please return to the ED.

## 2020-03-15 NOTE — ED Notes
3:51 PM Chief Complaint Patient presents with ? Knee Pain   Patient here s/p mechanical fall last night-- fell onto right cheek, right shoulder, complaining of knot to left knee with pain with difficulty ambulating. No LOC. Denied blood thinners.    Past Medical History: Diagnosis Date ? A-fib (HC Code) (HC CODE)  ? CPAP (continuous positive airway pressure) dependence   has c-pap; doesnt use it ;  too much pressure ? Diabetes mellitus (HC Code) (HC CODE)  ? High cholesterol  ? Hypertension  ? Iron deficiency  ? Neuropathy (HC Code)  ? Obstructive sleep apnea  4:22 PM Pt transported to radiology via w/c with transporter in stable condition. Darcella Gasman, RN

## 2020-03-15 NOTE — Telephone Encounter
Bariatric surgery processSpoke with patient regarding weight loss goalPatient working on diet, making chicken broth Patient going for COVID booster and to Ed today s/p fall in home, bruised kneeEncouragement/support given

## 2020-03-16 ENCOUNTER — Encounter: Admit: 2020-03-16 | Payer: PRIVATE HEALTH INSURANCE

## 2020-03-19 ENCOUNTER — Inpatient Hospital Stay: Admit: 2020-03-19 | Discharge: 2020-03-19 | Payer: MEDICAID

## 2020-03-19 NOTE — ED Provider Notes
HistoryChief Complaint Patient presents with ? Knee Pain   Patient here s/p mechanical fall last night-- fell onto right cheek, right shoulder, complaining of knot to left knee with pain with difficulty ambulating. No LOC. Denied blood thinners.    Bethany Wiggins is a 59yo female with significant past medical history of AFib not on anticoagulation, diabetes, hypertension, high cholesterol, neuropathy presenting to the emergency department after a fall last night.  Patient states that she was wearing socks when she slipped on the floor fell into a doorway striking her right shoulder and then onto the ground landing on her left knee an outstretched left wrist.  She denies head strike.  She reports she was able to get herself up and now has pain and swelling to the left knee and bruising to the right shoulder.  She has taken Tylenol for her symptoms.  She presents for evaluation.The history is provided by the patient. OtherThis is a new problem. The current episode started yesterday. The problem occurs constantly. The problem has not changed since onset.Pertinent negatives include no chest pain, no abdominal pain, no headaches and no shortness of breath. Nothing aggravates the symptoms. Nothing relieves the symptoms. She has tried nothing for the symptoms.  Past Medical History: Diagnosis Date ? A-fib (HC Code) (HC CODE)  ? CPAP (continuous positive airway pressure) dependence   has c-pap; doesnt use it ;  too much pressure ? Diabetes mellitus (HC Code) (HC CODE)  ? High cholesterol  ? Hypertension  ? Iron deficiency  ? Neuropathy (HC Code)  ? Obstructive sleep apnea  Past Surgical History: Procedure Laterality Date ? DENTAL SURGERY   ? SPLIT SLEEP STUDY  03/16/2018 Family History Problem Relation Age of Onset ? Hypertension Father  ? Diabetes Father  ? Coronary Artery Disease Father       stents ? Cervical cancer Sister  ? Diabetes Paternal Aunt  ? Hypertension Paternal Aunt  ? Uterine cancer Paternal Aunt       ?uterine cancner ? Diabetes Paternal Uncle  ? Hypertension Paternal Uncle  ? Breast cancer Neg Hx  ? Ovarian cancer Neg Hx  ? Colon cancer Neg Hx  Social History Socioeconomic History ? Marital status: Single   Spouse name: Not on file ? Number of children: Not on file ? Years of education: Not on file ? Highest education level: Not on file Tobacco Use ? Smoking status: Former Smoker   Packs/day: 0.10   Years: 40.00   Pack years: 4.00   Types: Cigarettes   Quit date: 10/16/2018   Years since quitting: 1.4 ? Smokeless tobacco: Never Used ? Tobacco comment: 1/2-1ppd since age 23; quit 10/2018 w/ some relapses Vaping Use ? Vaping Use: Never used Substance and Sexual Activity ? 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 Drug use: Not Currently   Types: Cocaine   Comment: long time hx of cocaine abuse'/ not in past year ? 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  -  Patient reports live in friend's house, would like her own place, therefore writer referred her to the [employer] and [employer] [address] office.  -No income, lost her job and pending on unemployment benefit. Food insecurity.  -Transportation, she have her own vehicle. ED Other Social History ? E-cigarette status Never User  ? E-Cigarette Use Never User  E-cigarette/Vaping Substances E-cigarette/Vaping Devices Review of Systems Constitutional: Negative.  HENT: Negative.  Respiratory: Negative for shortness of breath.  Cardiovascular: Negative for chest pain. Gastrointestinal: Negative for abdominal pain. Musculoskeletal: Positive for joint swelling. Skin: Negative.  Neurological: Negative for headaches. All other systems reviewed and are negative. Physical ExamED Triage Vitals [03/15/20 1548]BP: 131/82Pulse: 67Pulse from  O2 sat: n/aResp: 17Temp: 98.3 ?F (36.8 ?C)Temp src: OralSpO2: 96 % BP 131/82  - Pulse 67  - Temp 98.3 ?F (36.8 ?C) (Oral)  - Resp 17  - Wt (!) 156.5 kg  - SpO2 96%  - BMI 61.11 kg/m? Physical ExamVitals and nursing note reviewed. Constitutional:     General: She is not in acute distress.   Appearance: Normal appearance. She is not ill-appearing or toxic-appearing. HENT:    Head: Normocephalic and atraumatic.    Nose: Nose normal. Eyes:    General: No scleral icterus.Cardiovascular:    Rate and Rhythm: Normal rate.    Heart sounds: S1 normal and S2 normal. Pulmonary:    Effort: Pulmonary effort is normal.    Breath sounds: No decreased breath sounds. Musculoskeletal:       General: Normal range of motion.    Right shoulder: No swelling, deformity, effusion, laceration, tenderness, bony tenderness or crepitus. Normal range of motion. Normal strength. Normal pulse.    Left wrist: Normal. No swelling, deformity, effusion, lacerations, tenderness, bony tenderness, snuff box tenderness or crepitus. Normal range of motion. Normal pulse.    Cervical back: Normal range of motion.    Left knee: Swelling and ecchymosis present. Tenderness present.      Legs:Skin:   General: Skin is warm.    Capillary Refill: Capillary refill takes less than 2 seconds. Neurological:    Mental Status: She is alert and oriented to person, place, and time. Psychiatric:       Behavior: Behavior is cooperative.  ProceduresProcedures ED COURSEPatient Reevaluation: ED Attestation: PA/APRNFace to face evaluation was performed by me in collaboration with the Advanced Practice Provider to assess for significant health threats.Fall with knee and shoulder injury.No x-ray evidence to suggest fracture. Follow-up with PCPReturn sooner as neededReinier Sandria Bales TonderClinical Impressions as of Mar 16 1715 Fall, initial encounter Assessment & Plan 59yo female with a significant PMHx of AFib not on anticoagulation, diabetes, hypertension, high cholesterol, neuropathy  presents to ED today with fallDifferential Diagnosis Includes: contusion, fracturePlan:Labs -Diagnostics -XR right shoulder, left wrist, left kneeMedications - Consults -Reassessment: XR Knee 4V left (Final result)Result time 03/15/20 16:58:40Final result by Madie Reno, MD (03/15/20 16:58:40)      Impression:  No acute osseous injury. Stable moderate degenerative changes with Reported And Signed By: Madie Reno, MD ? [facility] Radiology and Biomedical Imaging    Narrative:  STUDY: XR KNEE LEFT AP LATERAL AND OBLIQUES INDICATION: fall COMPARISON: XR KNEE BILATERAL AP AND LATERAL ([facility]) 2020-Jun-17 FINDINGS: No fracture, dislocation or radiopaque foreign body. Moderate degenerative changes, prominent in the medial compartment and patellofemoral compartment, not significantly changed.     ? ? XR Shoulder Right Min 2 Views (Final result)Result time 03/15/20 16:59:54Procedure changed from XR Shoulder Right AP Internal AP  ExternalFinal result by Madie Reno, MD (03/15/20 16:59:54)      Impression:  No acute osseous injury. Reported And Signed By: Madie Reno, MD ? [facility] and Biomedical Imaging    Narrative:  STUDY: XR SHOULDER RIGHT MIN 2 VIEWS INDICATION: fall COMPARISON: None FINDINGS: No fracture, dislocation or radiopaque foreign body. Mild degenerative changes in the acromioclavicular and glenohumeral humeral joints.     ? ? Wrist 3V Left (Final result)Result time 03/15/20 17:01:49Final result by Madie Reno, MD (03/15/20 17:01:49)      Impression:  No acute osseous injury. Reported And Signed By: Madie Reno, MD ? [facility] Radiology and Biomedical Imaging    Narrative:  STUDY: XR WRIST LEFT PA LATERAL AND OBLIQUE INDICATION: fall COMPARISON: None FINDINGS: No fracture, dislocation or radiopaque foreign body.     5:14 PMImaging as aboveWill dispo Disposition:homePrescriptions given: noneLabs F/U: noneOut-patient F/U: PMDRTC instructions, supportive/anticipatory guidance discussed Benancio Deeds, APRNEmergency [phone] ED DispositionDischarge Neva Seat, APRN12/01/21 1716 Duffy Rhody, Bobbye Riggs, MD12/05/21 [phone]

## 2020-03-19 NOTE — ED Triage Note
Provider Triage Note  59 y.o. year old female  presents with I need a release form that says I can go back to work tomorrow:  Physical Exam: non-toxic appearing Orders placed in triage: no  Disposition: Express Care for further evaluation This initial provider screening is meant to expedite workup for patients and is not meant as definitive care. Please refer to subsequent notes for full medical evaluation of the patient.

## 2020-03-19 NOTE — Discharge Instructions
Stay well hydratedIce your bruisesCall your doctor tomorrow to arrange outpatient follow-upReturn to the ER if your symptoms worsen, persist or you feel ill for any other reason(s).

## 2020-03-20 ENCOUNTER — Encounter: Admit: 2020-03-20 | Payer: PRIVATE HEALTH INSURANCE

## 2020-03-22 ENCOUNTER — Encounter: Admit: 2020-03-22 | Payer: PRIVATE HEALTH INSURANCE

## 2020-03-22 ENCOUNTER — Telehealth: Admit: 2020-03-22 | Payer: PRIVATE HEALTH INSURANCE

## 2020-03-22 ENCOUNTER — Ambulatory Visit: Admit: 2020-03-22 | Payer: MEDICAID

## 2020-03-22 DIAGNOSIS — E611 Iron deficiency: Secondary | ICD-10-CM

## 2020-03-22 DIAGNOSIS — I4891 Unspecified atrial fibrillation: Secondary | ICD-10-CM

## 2020-03-22 DIAGNOSIS — E119 Type 2 diabetes mellitus without complications: Secondary | ICD-10-CM

## 2020-03-22 DIAGNOSIS — G629 Polyneuropathy, unspecified: Secondary | ICD-10-CM

## 2020-03-22 DIAGNOSIS — I10 Essential (primary) hypertension: Secondary | ICD-10-CM

## 2020-03-22 DIAGNOSIS — G4733 Obstructive sleep apnea (adult) (pediatric): Secondary | ICD-10-CM

## 2020-03-22 DIAGNOSIS — E7800 Pure hypercholesterolemia, unspecified: Secondary | ICD-10-CM

## 2020-03-22 DIAGNOSIS — Z9884 Bariatric surgery status: Secondary | ICD-10-CM

## 2020-03-22 DIAGNOSIS — Z9989 Dependence on other enabling machines and devices: Secondary | ICD-10-CM

## 2020-03-22 NOTE — Telephone Encounter
Bariatric surgery processSpoke with patient regarding?weight loss goalPatient continues working on diet Weight Loss success this week weight 335#, goal 330#sEncouragement/support given

## 2020-03-28 ENCOUNTER — Ambulatory Visit: Admit: 2020-03-28 | Payer: MEDICAID

## 2020-03-28 ENCOUNTER — Encounter: Admit: 2020-03-28 | Payer: PRIVATE HEALTH INSURANCE

## 2020-03-28 DIAGNOSIS — G629 Polyneuropathy, unspecified: Secondary | ICD-10-CM

## 2020-03-28 DIAGNOSIS — I4891 Unspecified atrial fibrillation: Secondary | ICD-10-CM

## 2020-03-28 DIAGNOSIS — I10 Essential (primary) hypertension: Secondary | ICD-10-CM

## 2020-03-28 DIAGNOSIS — Z9884 Bariatric surgery status: Secondary | ICD-10-CM

## 2020-03-28 DIAGNOSIS — G4733 Obstructive sleep apnea (adult) (pediatric): Secondary | ICD-10-CM

## 2020-03-28 DIAGNOSIS — E611 Iron deficiency: Secondary | ICD-10-CM

## 2020-03-28 DIAGNOSIS — E119 Type 2 diabetes mellitus without complications: Secondary | ICD-10-CM

## 2020-03-28 DIAGNOSIS — E7800 Pure hypercholesterolemia, unspecified: Secondary | ICD-10-CM

## 2020-03-28 DIAGNOSIS — Z9989 Dependence on other enabling machines and devices: Secondary | ICD-10-CM

## 2020-03-29 ENCOUNTER — Telehealth: Admit: 2020-03-29 | Payer: PRIVATE HEALTH INSURANCE

## 2020-03-29 NOTE — Telephone Encounter
Bariatric surgery processSpoke with patient regarding?weight loss goalPatient?continues working on diet Met with RD Gladys Damme 03/28/20, support, vitamin information providedPatient requested email of vitamin handouts so she could print at the libraryPDFs sent via emailEncouragement/support given

## 2020-03-31 ENCOUNTER — Telehealth: Admit: 2020-03-31 | Payer: PRIVATE HEALTH INSURANCE

## 2020-03-31 ENCOUNTER — Encounter: Admit: 2020-03-31 | Payer: PRIVATE HEALTH INSURANCE

## 2020-03-31 NOTE — Telephone Encounter
Left voicemail for patient to return my call regarding:Next support call will be 04/12/20

## 2020-04-11 ENCOUNTER — Encounter: Admit: 2020-04-11 | Payer: MEDICAID

## 2020-04-12 ENCOUNTER — Telehealth: Admit: 2020-04-12 | Payer: PRIVATE HEALTH INSURANCE | Attending: Family

## 2020-04-12 ENCOUNTER — Encounter: Admit: 2020-04-12 | Payer: MEDICAID | Attending: Family

## 2020-04-12 NOTE — Telephone Encounter
PT called missed her appointment this morning. Next available appointment is not until 06/2020 pt is requesting to be seen sooner.Please advise. Bariatric surgery processSpoke with patient regarding?weight loss goalPatient?continues?working on diet?reschedued appt w/ APRN Dupree to 1/14/22Appt with RD Guibory 04/19/20 for diet support and ht/wt measurementEncouragement/support givenChristine Jeanella Anton RN?

## 2020-04-12 NOTE — ED Provider Notes
HistoryChief Complaint Patient presents with ? Letter for School/Work   Pt was seen Wednesday night and fell. Pt states she wants to go back to work but needs a work note.   Pt comes to the ER requesting a return to work note.  She was evaluated on 03/15/2020 after falling.  Pt states she feels much better now and would like to return to work tomorrow, but a needs a note to do so. PMHX: reviewedSOCIAL HX: reviewed.  Past Medical History: Diagnosis Date ? A-fib (HC Code) (HC CODE)  ? CPAP (continuous positive airway pressure) dependence   has c-pap; doesnt use it ;  too much pressure ? Diabetes mellitus (HC Code) (HC CODE)  ? High cholesterol  ? Hypertension  ? Iron deficiency  ? Neuropathy (HC Code)  ? Obstructive sleep apnea  Past Surgical History: Procedure Laterality Date ? DENTAL SURGERY   ? SPLIT SLEEP STUDY  03/16/2018 Family History Problem Relation Age of Onset ? Hypertension Father  ? Diabetes Father  ? Coronary Artery Disease Father       stents ? Cervical cancer Sister  ? Diabetes Paternal Aunt  ? Hypertension Paternal Aunt  ? Uterine cancer Paternal Aunt       ?uterine cancner ? Diabetes Paternal Uncle  ? Hypertension Paternal Uncle  ? Breast cancer Neg Hx  ? Ovarian cancer Neg Hx  ? Colon cancer Neg Hx  Social History Socioeconomic History ? Marital status: Single   Spouse name: Not on file ? Number of children: Not on file ? Years of education: Not on file ? Highest education level: Not on file Tobacco Use ? Smoking status: Former Smoker   Packs/day: 0.10   Years: 40.00   Pack years: 4.00   Types: Cigarettes   Quit date: 10/16/2018   Years since quitting: 1.4 ? Smokeless tobacco: Never Used ? Tobacco comment: 1/2-1ppd since age 87; quit 10/2018 w/ some relapses Vaping Use ? Vaping Use: Never used Substance and Sexual Activity ? 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 Drug use: Not Currently   Types: Cocaine   Comment: long time hx of cocaine abuse'/ not in past year ? 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  -Patient reports live in friend's house, would like her own place, therefore Clinical research associate referred her to the [employer] and [employer] office.  -No income, lost her job and pending on unemployment benefit. Food insecurity.  -Transportation, she have her own vehicle. ED Other Social History ? E-cigarette status Never User  ? E-Cigarette Use Never User  E-cigarette/Vaping Substances E-cigarette/Vaping Devices Review of Systems Constitutional: Negative for chills and fever. HENT: Negative for drooling and nosebleeds.  Eyes: Negative for photophobia. Respiratory: Negative for shortness of breath.  Cardiovascular: Negative for chest pain. Gastrointestinal: Negative for abdominal pain, diarrhea, nausea and vomiting. Genitourinary: Negative for dysuria. Skin: Negative for pallor. Neurological: Negative for headaches. Psychiatric/Behavioral: Negative for agitation.  Physical ExamED Triage Vitals [03/19/20 1655]BP: Marland Kitchen)  148/92Pulse: 77Pulse from  O2 sat: n/aResp: 16Temp: 97.3 ?F (36.3 ?C)Temp src: TemporalSpO2: 95 % BP (!) 148/92  - Pulse 77  - Temp 97.3 ?F (36.3 ?C) (Temporal)  - Resp 16  - SpO2 95% Physical ExamVitals reviewed. Constitutional:     General: She is not in acute distress.   Appearance: Normal appearance. She is not ill-appearing, toxic-appearing or diaphoretic. HENT:    Head: Atraumatic. Eyes:    General:       Right eye: No discharge.       Left eye: No discharge. Cardiovascular:    Rate and Rhythm: Normal rate and regular rhythm. Pulmonary:    Effort: Pulmonary effort is normal. No respiratory distress.    Breath sounds: Normal breath sounds. No stridor. No wheezing or rhonchi. Abdominal:    General: There is no distension.    Palpations: There is no mass.    Tenderness: There is no abdominal tenderness. There is no guarding or rebound.    Hernia: No hernia is present. Musculoskeletal:       General: No swelling.      Arms:   Cervical back: Neck supple. No rigidity.      Legs:Skin:   General: Skin is warm. Neurological:    General: No focal deficit present.    Mental Status: She is alert and oriented to person, place, and time. Psychiatric:       Mood and Affect: Mood normal.  ProceduresProcedures ED COURSEPatient Reevaluation: MDM: Pt comes to the ER requesting a return to work note.  She was evaluated on 03/15/2020 after falling.  Pt states she feels much better now and would like to return to work tomorrow, but a needs a note to do so. Plan: no further work-up required Clinical Impressions as of Mar 19 1704 Contusion of multiple sites of left lower extremity, subsequent encounter Contusion of right shoulder, subsequent encounter  ED DispositionDischarge Horald Chestnut, MD12/05/21 1705 Horald Chestnut, MD12/05/21 1826 Horald Chestnut, MD12/29/21 [phone]

## 2020-04-16 ENCOUNTER — Encounter: Admit: 2020-04-16 | Payer: PRIVATE HEALTH INSURANCE

## 2020-04-17 ENCOUNTER — Encounter: Admit: 2020-04-17 | Payer: MEDICAID

## 2020-04-17 ENCOUNTER — Encounter: Admit: 2020-04-17 | Payer: PRIVATE HEALTH INSURANCE

## 2020-04-18 ENCOUNTER — Ambulatory Visit: Admit: 2020-04-18 | Payer: MEDICAID | Attending: Registered"

## 2020-04-18 ENCOUNTER — Ambulatory Visit: Admit: 2020-04-18 | Payer: MEDICAID

## 2020-04-18 ENCOUNTER — Telehealth: Admit: 2020-04-18 | Payer: PRIVATE HEALTH INSURANCE

## 2020-04-18 ENCOUNTER — Encounter: Admit: 2020-04-18 | Payer: PRIVATE HEALTH INSURANCE | Attending: Surgery

## 2020-04-18 NOTE — Telephone Encounter
Left message to call and speak with this Clinical research associate

## 2020-04-18 NOTE — Telephone Encounter
Spoke with patient will be out of work from 05/11/2020-06/08/2020, advised will fax over letter to employer and have in [place] for her records.

## 2020-04-18 NOTE — Telephone Encounter
-----   Message from Duncan Dull sent at 04/18/2020  1:54 PM EST -----Hi ChristinePatient needs letter stating when surgery is going to be(05/11/20) and how long she will be out of work. Please email it to [employer]

## 2020-04-19 ENCOUNTER — Telehealth: Admit: 2020-04-19 | Payer: PRIVATE HEALTH INSURANCE

## 2020-04-19 DIAGNOSIS — Z6841 Body Mass Index (BMI) 40.0 and over, adult: Secondary | ICD-10-CM

## 2020-04-19 NOTE — Telephone Encounter
Bariatric surgery processSpoke with patient regarding  final   required steps of bariatric processWeight loss goalPatient states diet going well,attended pre-op claa yesterdaywill schedule in person ht/wt prior to APRN Dupree appt 1/14/22Support/Encouragement given

## 2020-04-19 NOTE — Group Note
Group Start Time:  1:00 PM	End Time:  2:00 PM Facilitators: Bertell Maria, RD Bariatric Education Group Documentation NotePt seen for pre op teaching in group format. Instructed on pre operative diet to promote weight loss and possible reduction in liver size prior to surgery. Provided pt with packet of information that was reviewed including post op diet steps 1-5 with a focus on protein and hydration. Sample menus provided. Vitamin recommendations reviewed types and dosage.  Exercise guidelines reviewed. RD reviewed pre op prep/ day of & day before surgery, blood clot prevention, medication management, pain control.  Will f/u with patient post operatively or as needed. Billable time for RD portion of class: 60 minutesAllison Silverio Hagan MS, RDN, CSOWMVIDEO TELEHEALTH VISIT: This clinician is part of the telehealth program and is conducting this visit in a currently approved location. For this visit the clinician and patient were present via interactive audio & video telecommunications system that permits real-time communications. Consent was signed via the Patient Acknowledgement and Financial Authorization Form and the Ambulatory Telehealth Consent Form. State patient is located in: [place] clinician is appropriately licensed in the above state to provide care for this visit. Other individuals present during the telehealth encounter and their role/relation: noneIf billing based on time, please complete (Not required if billing based on MDM):                           Total time spent in medical video consultation: 60; Total time spent by the provider on the day of service, which includes time spent on chart review, medical video consultation, education, coordination of care/services and counseling Because this visit was completed over video, a hands-on physical exam was not performed.  Patient/parent or guardian understands and knows to call back if condition changes.

## 2020-04-20 ENCOUNTER — Encounter: Admit: 2020-04-20 | Payer: MEDICAID

## 2020-04-26 ENCOUNTER — Telehealth: Admit: 2020-04-26 | Payer: PRIVATE HEALTH INSURANCE

## 2020-04-26 NOTE — Telephone Encounter
Bariatric surgery processSpoke with patient regarding  final steps of bariatric processPatient scheduled for final ht/wt and check in with APRN DupreeStarts 2 week pre-op diet tomorrowEncouragement/ support given

## 2020-04-27 ENCOUNTER — Ambulatory Visit: Admit: 2020-04-27 | Payer: MEDICAID

## 2020-04-27 DIAGNOSIS — E669 Obesity, unspecified: Secondary | ICD-10-CM

## 2020-04-28 ENCOUNTER — Encounter: Admit: 2020-04-28 | Payer: PRIVATE HEALTH INSURANCE | Attending: Family

## 2020-04-28 ENCOUNTER — Ambulatory Visit: Admit: 2020-04-28 | Payer: MEDICAID | Attending: Family

## 2020-04-28 DIAGNOSIS — E611 Iron deficiency: Secondary | ICD-10-CM

## 2020-04-28 DIAGNOSIS — G4733 Obstructive sleep apnea (adult) (pediatric): Secondary | ICD-10-CM

## 2020-04-28 DIAGNOSIS — I4891 Unspecified atrial fibrillation: Secondary | ICD-10-CM

## 2020-04-28 DIAGNOSIS — E7800 Pure hypercholesterolemia, unspecified: Secondary | ICD-10-CM

## 2020-04-28 DIAGNOSIS — G629 Polyneuropathy, unspecified: Secondary | ICD-10-CM

## 2020-04-28 DIAGNOSIS — I10 Essential (primary) hypertension: Secondary | ICD-10-CM

## 2020-04-28 DIAGNOSIS — E119 Type 2 diabetes mellitus without complications: Secondary | ICD-10-CM

## 2020-04-28 DIAGNOSIS — Z9989 Dependence on other enabling machines and devices: Secondary | ICD-10-CM

## 2020-04-28 NOTE — Progress Notes
Bethany Wiggins returns for her bariatric follow up visit today to discuss weight loss medication options. The patient is currently going through the bariatric surgery pre-op process- interested in sleeve gastrectomy. She has gained some weight throughout the pre-op process. She was started on Ozempic to aid in pre-op weight loss- she reports decreased appetite. Currently taking 1 mg weekly. She has lost 28 lbs since starting OzempicDrinking 60-90 ounces of water daily Started Ozempic 5/5/21Starting MWL weight 358lbProgram starting weight 336lb Diet recall is as follows:Currently on 2 week liquid pre-op diet. Today she weighs (!) 149.7 kg and her current Body mass index is 58.46 kg/m?Marland Kitchen Weight 330 lbs.She exercises never formally. Her dietary compliance is excellent.Alcohol Use: no  	Frequency: N/ADrug Use: no 		Frequency: N/ANicotine history:no	Packs per day:  N/ANSAID use: no 	Frequency: N/AOral/IV steroid use: no Frequency: N/ATaking vitamins?  yesIf yes, which vitamins are he or she taking?  Multivitamin, ironPHQ-2:Little interest or pleasure in doing things: Not at all Feeling down, depressed, or hopeless: Not at all PHQ-9 Total Score: 0Need for mental health referral: noCurrent Outpatient Medications on File Prior to Visit Medication Sig Dispense Refill ? allopurinoL (ZYLOPRIM) 100 mg tablet Take 1 tablet (100 mg total) by mouth daily. 90 tablet 1 ? aspirin 81 mg EC delayed release tablet Take 1 tablet (81 mg total) by mouth daily. 90 tablet 1 ? clindamycin (CLEOCIN T) 1 % external solution Apply topically 2 (two) times daily. 30 mL 0 ? clotrimazole-betamethasone (LOTRISONE) 1-0.05 % cream Apply topically 2 (two) times daily. 45 g 2 ? cyclobenzaprine (FLEXERIL) 5 mg tablet Take 1 tablet (5 mg total) by mouth 2 (two) times daily as needed for muscle spasms. 30 tablet 0 ? ferrous sulfate (FEOSOL) 325 mg (65 mg iron) tablet Take 1 tablet by mouth daily.   ? fluticasone propionate (FLONASE) 50 mcg/actuation nasal spray Use 2 sprays in each nostril daily. 16 g 0 ? furosemide (LASIX) 20 mg tablet Take 1 tablet (20 mg total) by mouth daily. For leg swelling 90 tablet 1 ? gabapentin (NEURONTIN) 600 mg tablet Take 1 tablet (600 mg total) by mouth 3 (three) times daily with meals. 90 tablet 2 ? levocetirizine (XYZAL) 5 mg tablet Take 1 tablet (5 mg total) by mouth every evening before dinner. 90 tablet 1 ? LIDODERM 5 % Place 2 to 3 patches onto the skin daily. Remove & Discard patch within 12 hours or as directed by provider. 30 patch 0 ? metFORMIN (GLUCOPHAGE) 500 mg Immediate Release tablet Take 1 tablet (500 mg total) by mouth 2 (two) times daily. 180 tablet 1 ? multivitamin (MULTIVITAMIN) tablet Take 1 tablet by mouth daily.   ? rosuvastatin (CRESTOR) 20 mg tablet Take 1 tablet (20 mg total) by mouth daily. 90 tablet 1 ? semaglutide (OZEMPIC) 1 mg/dose (2 mg/1.5 mL) Pen Injector Inject 1 mg under the skin once a week. 9 mL 0 ? triamterene-hydroCHLOROthiazide (DYAZIDE) 37.5-25 mg per capsule Take 1 capsule by mouth every morning. 90 capsule 1 ? blood sugar diagnostic (FREESTYLE LITE) test strips Use as instructed to check blood sugar twice daily. ICD 10: E11.9 100 each 0 ? ketamine/doxepin/nifedipine/pentoxifylline: 10-5-8-8% topical (compound) Apply topically up to four times daily as needed to feet for symptoms of burning, electricity, and tingling. (Patient not taking: Reported on 01/17/2020) 60 g 5 ? ketamine/doxepin/nifedipine/pentoxifylline: 10-5-8-8% topical (compound) Apply topically up to four times daily as needed to feet for symptoms of burning, electricity, and tingling. (Patient not taking: Reported on 01/17/2020) 60 g 5 ? Lactobacillus acidophilus (  PROBIOTIC ORAL) Take by mouth daily.  (Patient not taking: Reported on 04/28/2020)   ? lancets (RELIAMED LANCET) 30 gauge Misc lancets Use as instructed to check blood sugar twice daily. ICD 10: E11.9 100 each 0 ? Miscellaneous Medical Supply Please send 4 legged cane, for stability and chronic knee pain, Dx M25.561 1 each 0 No current facility-administered medications on file prior to visit.  Physical Exam: Ht 5' 3 (1.6 m)  - Wt (!) 149.7 kg  - BMI 58.46 kg/m? General:  Alert, oriented, no acute distress Recent bariatric labs reviewed: noBariatric labs ordered today: noMs. Wiggins is currrently going through the pre-op process for bariatric surgery and would benefit from pre-op weight loss. She has gained some weight during the process. She was started on Ozempic to aid in pre-op weight loss- she reports decreased appetite. Currently taking 1 mg weekly. She has lost 28 lbs since starting Ozempic. Encouraged to continue with weight loss efforts- currently on 2 week liquid pre-op diet. She has a Photographer but has not been going regularly- encouraged to start using this resource. She is currently scheduled for surgery 05/11/20. Will keep pre-op with Dr. Gwenevere Abbot. Patient amenable to plan.My recommendations for Bethany Wiggins AOZ:HYQMVHQI exerciseContinue with good dietary habitsTake Multivitamin and Calcium dailyContinue Ozempic 1 mg weeklyPre-op weight goal <330lbs.Bethany Wiggins will follow up 2 weeks post-op  VIDEO TELEHEALTH VISIT: This clinician is part of the telehealth program and is conducting this visit in a currently approved location. For this visit the clinician and patient were present via interactive audio & video telecommunications system that permits real-time communications, via the  [employer].Patient's use of the telehealth platform followed consent and acknowledges agreement to permit telehealth for this visit. State patient is located in: [place]The clinician is appropriately licensed in the above state to provide care for this visit. Other individuals present during the telehealth encounter and their role/relation: noneIf billing based on time, please complete (Not required if billing based on MDM):                           Total time spent in medical video consultation: 30 minutes; Total time spent by the provider on the day of service, which includes time spent on chart review, medical video consultation, education, coordination of care/services and counseling Because this visit was completed over video, a hands-on physical exam was not performed.  Patient/parent or guardian understands and knows to call back if condition changes.

## 2020-05-02 ENCOUNTER — Telehealth: Admit: 2020-05-02 | Payer: PRIVATE HEALTH INSURANCE | Attending: Surgery

## 2020-05-02 NOTE — Telephone Encounter
Pt confirming the appt on the 20th is a phone call and not an in person appt. .. adv yes. Phone call.

## 2020-05-03 ENCOUNTER — Inpatient Hospital Stay: Admit: 2020-05-03 | Payer: PRIVATE HEALTH INSURANCE | Attending: Family

## 2020-05-04 ENCOUNTER — Encounter: Admit: 2020-05-04 | Payer: PRIVATE HEALTH INSURANCE | Attending: Adult Health

## 2020-05-04 ENCOUNTER — Telehealth: Admit: 2020-05-04 | Payer: PRIVATE HEALTH INSURANCE

## 2020-05-04 DIAGNOSIS — Z01818 Encounter for other preprocedural examination: Secondary | ICD-10-CM

## 2020-05-04 NOTE — Telephone Encounter
Bariatric surgery processSpoke with patient regarding  final  preparations for bariatric surgeryPatient states presurgical diet going wellMet with Pre Admission APRN today, scheduled COVID testSupport/Encouragement given ?

## 2020-05-04 NOTE — Other
AppointmentInstructions discussed with the patient? YesCopy of instructions sent home with the patient? YesAppointment Providers: NP: Norris Cross   Attending: Beckey Downing

## 2020-05-05 ENCOUNTER — Ambulatory Visit: Admit: 2020-05-05 | Payer: PRIVATE HEALTH INSURANCE | Attending: Surgery

## 2020-05-05 DIAGNOSIS — E119 Type 2 diabetes mellitus without complications: Secondary | ICD-10-CM

## 2020-05-05 NOTE — Progress Notes
I had the pleasure of seeing one of your patients Bethany Wiggins in consultation today for laparoscopic sleeve gastrectomy.  This patient is a 60 y.o. female with a There is no height or weight on file to calculate BMI. based on a height of   and weight of  .This patient reported a history of a peak weight of 300.  She has been overweight since childhood.  Bethany Wiggins has tried numerous weight loss regimens over the years including Dietician Counseling. Her associate medical conditions include: diabetes mellitus.  Her cardiovascular risk factors besides obesity include: obesity (BMI >= 30 kg/m2).Current Outpatient Medications: ?  allopurinoL, 100 mg, Oral, Daily?  aspirin, 81 mg, Oral, Daily?  blood sugar diagnostic, Use as instructed to check blood sugar twice daily. ICD 10: E11.9?  clindamycin, Apply topically 2 (two) times daily.?  clotrimazole-betamethasone, Apply topically 2 (two) times daily.?  cyclobenzaprine, Take 1 tablet (5 mg total) by mouth 2 (two) times daily as needed for muscle spasms.?  ferrous sulfate, 1 tablet, Oral, Daily?  fluticasone propionate, 2 spray, each nostril, Daily?  furosemide, 20 mg, Oral, Daily?  gabapentin, 600 mg, Oral, TID WC?  ketamine/doxepin/nifedipine/pentoxifylline: 10-5-8-8% topical (compound), Apply topically up to four times daily as needed to feet for symptoms of burning, electricity, and tingling.?  lancets, Use as instructed to check blood sugar twice daily. ICD 10: E11.9?  levocetirizine, 5 mg, Oral, QPM AC?  Lidoderm, Place 2 to 3 patches onto the skin daily. Remove & Discard patch within 12 hours or as directed by provider.?  metFORMIN, 500 mg, Oral, BID?  Miscellaneous Medical Supply, Please send 4 legged cane, for stability and chronic knee pain, Dx M25.561?  multivitamin, 1 tablet, Oral, Daily?  rosuvastatin, 20 mg, Oral, Daily?  Ozempic, 1 mg, Subcutaneous, Weekly?  triamterene-hydroCHLOROthiazide, 1 capsule, Oral, QAM She is allergic to lisinopril.She  has a past medical history of A-fib (HC Code) (HC CODE), CPAP (continuous positive airway pressure) dependence, Diabetes mellitus (HC Code) (HC CODE), High cholesterol, Hypertension, Iron deficiency, Neuropathy (HC Code), and Obstructive sleep apnea. She  has a past surgical history that includes Split Sleep Study (03/16/2018) and Dental surgery. She  reports that she quit smoking about 18 months ago. Her smoking use included cigarettes. She has a 4.00 pack-year smoking history. She has never used smokeless tobacco. She reports previous drug use. Drug: Cocaine. She reports that she does not drink alcohol. Review of SystemsPertinent items are noted in HPI.    My assessment of Bethany Wiggins is that she is an excellent candidate for bariatric surgery.  My plan is to schedule the procedure in the near future. The risks and benefits of the surgery were explained in detail to Bethany Wiggins  .  The patient understands these risks and wishes to proceed.  Preoperatively, I will order a set of labs to assess her nutritional status.Thank you very much for involving me in your patient's care.  Please do not hesitate to call me with any questions you may have. Dorann Ou, MD, MPH, FACS, FASMBSVice Chair, QualityDivision Chief, Bariatric and Minimally Invasive SurgeryYale Department of [email or web address] TELEHEALTH VISIT: This clinician is part of the telehealth program and is conducting this visit in a currently approved location. For this visit the clinician and patient were present via interactive audio & video telecommunications system that permits real-time communications, via the [employer].Patient's use of the telehealth platform followed consent and acknowledges agreement to permit telehealth for this visit. State patient is located in: Medical laboratory scientific officer  is appropriately licensed in the above state to provide care for this visit. Other individuals present during the telehealth encounter and their role/relation: noneIf billing based on time, please complete (Not required if billing based on MDM):                           Total time spent in medical video consultation: 30; Total time spent by the provider on the day of service, which includes time spent on chart review, medical video consultation, education, coordination of care/services and counseling Because this visit was completed over video, a hands-on physical exam was not performed.  Patient/parent or guardian understands and knows to call back if condition changes.

## 2020-05-08 ENCOUNTER — Inpatient Hospital Stay: Admit: 2020-05-08 | Discharge: 2020-05-08 | Payer: MEDICAID

## 2020-05-08 DIAGNOSIS — Z01812 Encounter for preprocedural laboratory examination: Secondary | ICD-10-CM

## 2020-05-08 DIAGNOSIS — Z20822 Contact with and (suspected) exposure to covid-19: Secondary | ICD-10-CM

## 2020-05-08 DIAGNOSIS — Z01818 Encounter for other preprocedural examination: Secondary | ICD-10-CM

## 2020-05-08 LAB — COVID-19 CLEARANCE OR FOR PLACEMENT ONLY: BKR SARS-COV-2 RNA (COVID-19) (YH): NEGATIVE

## 2020-05-11 ENCOUNTER — Inpatient Hospital Stay: Admit: 2020-05-11 | Discharge: 2020-05-14 | Payer: MEDICAID | Source: Home / Self Care | Admitting: Surgery

## 2020-05-11 ENCOUNTER — Inpatient Hospital Stay: Admit: 2020-05-11 | Payer: PRIVATE HEALTH INSURANCE | Attending: Anesthesiology

## 2020-05-11 ENCOUNTER — Encounter: Admit: 2020-05-11 | Payer: PRIVATE HEALTH INSURANCE | Attending: Surgery

## 2020-05-11 DIAGNOSIS — E7800 Pure hypercholesterolemia, unspecified: Secondary | ICD-10-CM

## 2020-05-11 DIAGNOSIS — D649 Anemia, unspecified: Secondary | ICD-10-CM

## 2020-05-11 DIAGNOSIS — E119 Type 2 diabetes mellitus without complications: Secondary | ICD-10-CM

## 2020-05-11 DIAGNOSIS — I4891 Unspecified atrial fibrillation: Secondary | ICD-10-CM

## 2020-05-11 DIAGNOSIS — E611 Iron deficiency: Secondary | ICD-10-CM

## 2020-05-11 DIAGNOSIS — Z9989 Dependence on other enabling machines and devices: Secondary | ICD-10-CM

## 2020-05-11 DIAGNOSIS — I10 Essential (primary) hypertension: Secondary | ICD-10-CM

## 2020-05-11 DIAGNOSIS — M1990 Unspecified osteoarthritis, unspecified site: Secondary | ICD-10-CM

## 2020-05-11 DIAGNOSIS — G4733 Obstructive sleep apnea (adult) (pediatric): Secondary | ICD-10-CM

## 2020-05-11 DIAGNOSIS — G629 Polyneuropathy, unspecified: Secondary | ICD-10-CM

## 2020-05-11 DIAGNOSIS — K219 Gastro-esophageal reflux disease without esophagitis: Secondary | ICD-10-CM

## 2020-05-11 MED ORDER — CEFAZOLIN 1 GRAM SOLUTION FOR INJECTION
1 gram | INTRAVENOUS | Status: DC | PRN
Start: 2020-05-11 — End: 2020-05-11
  Administered 2020-05-11: 14:00:00 1 gram via INTRAVENOUS

## 2020-05-11 MED ORDER — SUGAMMADEX 100 MG/ML INTRAVENOUS SOLUTION
100 mg/mL | INTRAVENOUS | Status: DC | PRN
Start: 2020-05-11 — End: 2020-05-11
  Administered 2020-05-11: 15:00:00 100 mg/mL via INTRAVENOUS

## 2020-05-11 MED ORDER — ONDANSETRON HCL (PF) 4 MG/2 ML INJECTION SOLUTION
4 mg/2 mL | Status: CP
Start: 2020-05-11 — End: ?

## 2020-05-11 MED ORDER — FENTANYL (PF) 50 MCG/ML INJECTION SOLUTION
50 mcg/mL | INTRAVENOUS | Status: DC | PRN
Start: 2020-05-11 — End: 2020-05-11
  Administered 2020-05-11 (×4): 50 mcg/mL via INTRAVENOUS

## 2020-05-11 MED ORDER — PROPOFOL 10 MG/ML INTRAVENOUS EMULSION
10 mg/mL | Status: CP
Start: 2020-05-11 — End: ?

## 2020-05-11 MED ORDER — SODIUM CHLORIDE 0.9 % (FLUSH) INJECTION SYRINGE
0.9 % | Freq: Three times a day (TID) | INTRAVENOUS | Status: DC
Start: 2020-05-11 — End: 2020-05-11

## 2020-05-11 MED ORDER — SKIM MILK
ORAL | Status: DC | PRN
Start: 2020-05-11 — End: 2020-05-14

## 2020-05-11 MED ORDER — ONDANSETRON HCL (PF) 4 MG/2 ML INJECTION SOLUTION
42 mg/2 mL | Freq: Four times a day (QID) | INTRAVENOUS | Status: DC | PRN
Start: 2020-05-11 — End: 2020-05-14
  Administered 2020-05-11 – 2020-05-14 (×5): 4 mL via INTRAVENOUS

## 2020-05-11 MED ORDER — HYDROMORPHONE 0.5 MG/0.5 ML INJECTION SYRINGE
0.5 mg/ mL | INTRAVENOUS | Status: DC | PRN
Start: 2020-05-11 — End: 2020-05-11

## 2020-05-11 MED ORDER — LIDOCAINE (PF) 20 MG/ML (2 %) INJECTION SOLUTION
20 mg/mL (2 %) | INTRAVENOUS | Status: DC | PRN
Start: 2020-05-11 — End: 2020-05-11
  Administered 2020-05-11: 14:00:00 20 mg/mL (2 %) via INTRAVENOUS

## 2020-05-11 MED ORDER — CEFAZOLIN 1 GRAM SOLUTION FOR INJECTION
1 gram | Status: CP
Start: 2020-05-11 — End: ?

## 2020-05-11 MED ORDER — OXYCODONE IMMEDIATE RELEASE 5 MG TABLET
5 mg | ORAL | Status: DC | PRN
Start: 2020-05-11 — End: 2020-05-14
  Administered 2020-05-11 – 2020-05-13 (×7): 5 mg via ORAL

## 2020-05-11 MED ORDER — ALLOPURINOL 100 MG TABLET
100 mg | Freq: Every day | ORAL | Status: DC
Start: 2020-05-11 — End: 2020-05-14

## 2020-05-11 MED ORDER — FENTANYL (PF) 50 MCG/ML INJECTION SOLUTION
50 mcg/mL | Status: CP
Start: 2020-05-11 — End: ?

## 2020-05-11 MED ORDER — ASPIRIN 81 MG TABLET,DELAYED RELEASE
81 mg | Freq: Every day | ORAL | Status: DC
Start: 2020-05-11 — End: 2020-05-14

## 2020-05-11 MED ORDER — ROCURONIUM 10 MG/ML INTRAVENOUS SOLUTION
10 mg/mL | INTRAVENOUS | Status: DC | PRN
Start: 2020-05-11 — End: 2020-05-11
  Administered 2020-05-11 (×3): 10 mg/mL via INTRAVENOUS

## 2020-05-11 MED ORDER — FAMOTIDINE 4 MG/ML IN 0.9% SODIUM CHLORIDE (ADULT)
Freq: Two times a day (BID) | INTRAVENOUS | Status: DC
Start: 2020-05-11 — End: 2020-05-13
  Administered 2020-05-12 – 2020-05-13 (×4): 5.000 mL via INTRAVENOUS

## 2020-05-11 MED ORDER — LACTATED RINGERS INTRAVENOUS SOLUTION
INTRAVENOUS | Status: DC | PRN
Start: 2020-05-11 — End: 2020-05-11
  Administered 2020-05-11 (×2): via INTRAVENOUS

## 2020-05-11 MED ORDER — FUROSEMIDE 20 MG TABLET
20 mg | Freq: Every day | ORAL | Status: DC
Start: 2020-05-11 — End: 2020-05-14

## 2020-05-11 MED ORDER — BUPIVACAINE (PF) 0.25 % (2.5 MG/ML) INJECTION SOLUTION
0.252.5 % (2.5 mg/mL) | Status: DC
Start: 2020-05-11 — End: 2020-05-14

## 2020-05-11 MED ORDER — ACETAMINOPHEN 500 MG TABLET
500 mg | Freq: Once | ORAL | Status: CP
Start: 2020-05-11 — End: ?
  Administered 2020-05-11: 13:00:00 500 mg via ORAL

## 2020-05-11 MED ORDER — DEXAMETHASONE SODIUM PHOSPHATE 4 MG/ML INJECTION SOLUTION
4 mg/mL | Status: CP
Start: 2020-05-11 — End: ?

## 2020-05-11 MED ORDER — FENTANYL (PF) 50 MCG/ML INJECTION SOLUTION
50 mcg/mL | INTRAVENOUS | Status: DC | PRN
Start: 2020-05-11 — End: 2020-05-11
  Administered 2020-05-11 (×2): 50 mL via INTRAVENOUS

## 2020-05-11 MED ORDER — CHLORHEXIDINE GLUCONATE 0.12 % MOUTHWASH
0.12 % | Freq: Once | OROMUCOSAL | Status: CP
Start: 2020-05-11 — End: ?
  Administered 2020-05-11: 13:00:00 0.12 mL via OROMUCOSAL

## 2020-05-11 MED ORDER — SODIUM CHLORIDE 0.9 % IRRIGATION SOLUTION
0.9 % irrigation | Status: CP | PRN
Start: 2020-05-11 — End: ?
  Administered 2020-05-11 (×2): 0.9 % irrigation

## 2020-05-11 MED ORDER — DEXTROSE 50 % IN WATER (D50W) INTRAVENOUS SYRINGE
INTRAVENOUS | Status: DC | PRN
Start: 2020-05-11 — End: 2020-05-14

## 2020-05-11 MED ORDER — OXYCODONE IMMEDIATE RELEASE 5 MG TABLET
5 mg | ORAL | Status: DC | PRN
Start: 2020-05-11 — End: 2020-05-14
  Administered 2020-05-12: 03:00:00 5 mg via ORAL

## 2020-05-11 MED ORDER — SCOPOLAMINE 1 MG OVER 3 DAYS TRANSDERMAL PATCH
1 mg | Freq: Once | TRANSDERMAL | Status: CP
Start: 2020-05-11 — End: ?

## 2020-05-11 MED ORDER — LACTATED RINGERS INTRAVENOUS SOLUTION
INTRAVENOUS | Status: DC
Start: 2020-05-11 — End: 2020-05-12
  Administered 2020-05-11 (×2): 1000.000 mL/h via INTRAVENOUS

## 2020-05-11 MED ORDER — ONDANSETRON HCL (PF) 4 MG/2 ML INJECTION SOLUTION
4 mg/2 mL | INTRAVENOUS | Status: DC | PRN
Start: 2020-05-11 — End: 2020-05-11
  Administered 2020-05-11: 14:00:00 4 mg/2 mL via INTRAVENOUS

## 2020-05-11 MED ORDER — DEXAMETHASONE SODIUM PHOSPHATE 4 MG/ML INJECTION SOLUTION
4 mg/mL | INTRAVENOUS | Status: DC | PRN
Start: 2020-05-11 — End: 2020-05-11
  Administered 2020-05-11: 14:00:00 4 mg/mL via INTRAVENOUS

## 2020-05-11 MED ORDER — CELECOXIB 200 MG CAPSULE
200 mg | Freq: Once | ORAL | Status: CP
Start: 2020-05-11 — End: ?
  Administered 2020-05-11: 13:00:00 200 mg via ORAL

## 2020-05-11 MED ORDER — TRIAMTERENE 37.5 MG-HYDROCHLOROTHIAZIDE 25 MG CAPSULE
37.5-25 mg | Freq: Every morning | ORAL | Status: DC
Start: 2020-05-11 — End: 2020-05-14

## 2020-05-11 MED ORDER — ONDANSETRON HCL (PF) 4 MG/2 ML INJECTION SOLUTION
4 mg/2 mL | INTRAVENOUS | Status: DC | PRN
Start: 2020-05-11 — End: 2020-05-11
  Administered 2020-05-11: 16:00:00 4 mL via INTRAVENOUS

## 2020-05-11 MED ORDER — SODIUM CHLORIDE 0.9 % (FLUSH) INJECTION SYRINGE
0.9 % | INTRAVENOUS | Status: DC | PRN
Start: 2020-05-11 — End: 2020-05-11

## 2020-05-11 MED ORDER — FRUIT JUICE
ORAL | Status: DC | PRN
Start: 2020-05-11 — End: 2020-05-14

## 2020-05-11 MED ORDER — SUGAMMADEX 100 MG/ML INTRAVENOUS SOLUTION
100 mg/mL | Status: CP
Start: 2020-05-11 — End: ?

## 2020-05-11 MED ORDER — CETIRIZINE 5 MG TABLET
5 mg | Freq: Every evening | ORAL | Status: DC
Start: 2020-05-11 — End: 2020-05-14
  Administered 2020-05-12: 02:00:00 5 mg via ORAL

## 2020-05-11 MED ORDER — MIDAZOLAM 1 MG/ML INJECTION SOLUTION
1 mg/mL | Status: CP
Start: 2020-05-11 — End: ?

## 2020-05-11 MED ORDER — DEXTROSE 40 % ORAL GEL
40 % | ORAL | Status: DC | PRN
Start: 2020-05-11 — End: 2020-05-14

## 2020-05-11 MED ORDER — HEPARIN (PORCINE) 5,000 UNIT/ML INJECTION SOLUTION
5000 unit/mL | Freq: Three times a day (TID) | SUBCUTANEOUS | Status: DC
Start: 2020-05-11 — End: 2020-05-14
  Administered 2020-05-11 – 2020-05-14 (×9): via SUBCUTANEOUS

## 2020-05-11 MED ORDER — DIPHENHYDRAMINE 50 MG/ML INJECTION SOLUTION
50 mg/mL | Freq: Once | INTRAVENOUS | Status: DC | PRN
Start: 2020-05-11 — End: 2020-05-11

## 2020-05-11 MED ORDER — GABAPENTIN 600 MG TABLET
600 mg | Freq: Three times a day (TID) | ORAL | Status: DC
Start: 2020-05-11 — End: 2020-05-14
  Administered 2020-05-11 – 2020-05-14 (×9): 600 mg via ORAL

## 2020-05-11 MED ORDER — GLUCAGON 1 MG/ML IN STERILE WATER
Freq: Once | INTRAMUSCULAR | Status: DC | PRN
Start: 2020-05-11 — End: 2020-05-14

## 2020-05-11 MED ORDER — ACETAMINOPHEN 325 MG TABLET
325 mg | Freq: Four times a day (QID) | ORAL | Status: DC
Start: 2020-05-11 — End: 2020-05-14
  Administered 2020-05-11 – 2020-05-12 (×5): 325 mg via ORAL

## 2020-05-11 MED ORDER — HEPARIN (PORCINE) 5,000 UNIT/ML INJECTION SOLUTION
5000 unit/mL | Freq: Once | SUBCUTANEOUS | Status: CP
Start: 2020-05-11 — End: ?
  Administered 2020-05-11: 13:00:00 via SUBCUTANEOUS

## 2020-05-11 MED ORDER — SENNOSIDES 8.6 MG TABLET
8.6 mg | Freq: Two times a day (BID) | ORAL | Status: DC
Start: 2020-05-11 — End: 2020-05-14
  Administered 2020-05-12 – 2020-05-14 (×6): 8.6 mg via ORAL

## 2020-05-11 MED ORDER — MIDAZOLAM (PF) 1 MG/ML INJECTION SOLUTION
1 mg/mL | INTRAVENOUS | Status: DC | PRN
Start: 2020-05-11 — End: 2020-05-11
  Administered 2020-05-11: 14:00:00 1 mg/mL via INTRAVENOUS

## 2020-05-11 MED ORDER — CEFAZOLIN IV PUSH 1 GRAM VIAL & 0.9% SODIUM CHLORIDE (ADULT)
Freq: Once | INTRAVENOUS | Status: DC
Start: 2020-05-11 — End: 2020-05-11

## 2020-05-11 MED ORDER — ROSUVASTATIN 20 MG TABLET
20 mg | Freq: Every day | ORAL | Status: DC
Start: 2020-05-11 — End: 2020-05-14
  Administered 2020-05-12 – 2020-05-14 (×3): 20 mg via ORAL

## 2020-05-11 MED ORDER — HYDROMORPHONE 0.5 MG/0.5 ML INJECTION SYRINGE
0.5 mg/ mL | INTRAVENOUS | Status: DC | PRN
Start: 2020-05-11 — End: 2020-05-11
  Administered 2020-05-11: 16:00:00 0.5 mL via INTRAVENOUS

## 2020-05-11 MED ORDER — PROPOFOL 10 MG/ML INTRAVENOUS EMULSION
10 mg/mL | INTRAVENOUS | Status: DC | PRN
Start: 2020-05-11 — End: 2020-05-11
  Administered 2020-05-11: 14:00:00 10 mg/mL via INTRAVENOUS

## 2020-05-11 NOTE — Brief Op Note
[facility]        [id number] Patient DOB: 1960/07/07     Surgery Date: 1/27/2022Surgeon(s) and Role:   Dorann Ou, MD - PrimaryAssistant(s):Resident: Garen Grams Antonietta Jewel, MD; Andi Hence, MDStaff:  Circulator: Doran Clay, RNRelief Circulator: Westley Hummer, RNRelief Scrub: Samul Dada, AngelScrub Person: Katrinka Blazing, TonyaSurgical Tech Student: Ardyth Gal, NikkiPre-Op Diagnosis: Morbid obesity (HC Code) (HC CODE) [E66.01] Procedure(s) and Anesthesia Type:   * eras laparoscopic sleeve gastrectomy, egd - GENERALOperative Findings (enter relevant operative findings; do not refer to an operative report that is not yet transcribed):Small sliding hiatal hernia, normal observed gastric anatomySigns of infection present at the time of surgery at the operative site: None Blood and Blood Products: none                 Drains:  noneImplants: * No implants in log * Specimens: ID Type Source Tests Collected by Time 1 : gastric sleeve resection Tissue Stomach PATHOLOGY [facility]) Dorann Ou, MD 05/11/2020  9:11 AM  Clinical Staging: NAEBL: Minimal       Post Operative Diagnosis: * No post-op diagnosis entered * Antonietta Jewel Deshunda Thackston, MD1/27/202210:22 AM

## 2020-05-11 NOTE — Transfer Summaries
PACU to Floor Nursing Transfer NotePreop Diagnosis: Morbid obesity (HC Code) (HC CODE) [E66.01]Procedure Done: eras laparoscopic sleeve gastrectomy,Any Significant Events Intra-Op: noneAbnormal Assessment in PACU: noneLevel of Consciousness: awake and alert Last Set of VS:  Vitals:  05/11/20 0715 05/11/20 1027 05/11/20 1030 05/11/20 1039 BP:  (!) 153/81 135/85  Pulse:  70 72 68 Resp:  (!) 11 (!) 22 14 Temp:  97 ?F (36.1 ?C) 97 ?F (36.1 ?C)  TempSrc:  Temporal   SpO2:  96% 98% 96% Weight: (!) 143.9 kg    Height: 5' 3 (1.6 m)    Device (Oxygen Therapy): nasal cannula O2 Flow (L/min): 2Baseline Neuro/developmental Status: WDLLabs Collected: NoSpecial Needs of the Patient: noneAntibiotics: last dose given in OR: Ancef 3g@08 :50IV Access: Periph IV-single(adult) 10/25/18 1041 median cubital(antecubital fossa), right over-the-needle catheter system 20 gauge Nurse (Active)   Periph IV-single(adult) 05/11/20 0834 median cubital(antecubital fossa), right over-the-needle catheter system 20 gauge Nurse (Active) SiteCare/Dressing Status/Securement 3.15% Chlorhexadine /70 % alcohol 05/11/20 1027 Lumen 1 Patency/Care Patent 05/11/20 1027 Site Signs asymptomatic with no redness, no swelling, no drainage 05/11/20 1027 Phlebitis 0-->no symptoms 05/11/20 1027 Infiltration/Extravasation Assessment 0-->no symptoms 05/11/20 1027 Daily Review of Necessity ** completed 05/11/20 1027   Periph IV-single(adult) 05/11/20 0854 metacarpal(dorsum of hand), left over-the-needle catheter system 20 gauge Anesthesiologist (Active) SiteCare/Dressing Status/Securement 3.15% Chlorhexadine /70 % alcohol 05/11/20 1027 Lumen 1 Patency/Care Patent 05/11/20 1027 Site Signs asymptomatic with no redness, no swelling, no drainage 05/11/20 1027 Phlebitis 0-->no symptoms 05/11/20 1027 Infiltration/Extravasation Assessment 0-->no symptoms 05/11/20 1027 Daily Review of Necessity ** completed 05/11/20 1027 IV Fluids: Pain Assessment: Number Scale (0-10) 05/11/20 1029 abdomen-Pain Rating (0-10): Rest: 6Pain Assessment: Number Scale (0-10) 05/11/20 1029 abdomen-Pain Rating (0-10): Activity: 6 Pain Assessment: Number Scale (0-10) 05/11/20 1029 abdomen-Pharmaceutical Interventions: single medication modality Wound 05/11/20 0916 Incision abdomen-Incision Closure : unable to assess  Body Position: supine, head elevatedHead of Bed (HOB) Positioning: HOB at 30-45 degrees   Time of Last Void or Time that Urinary Catheter was Removed Intra-Op: has not voided Additional Info: patient arrived alert and oriented. Dilaudid  And fentanyl given for pain. Zofran given for mild nausea. Scopalamine patch to R ear.VSS. Sating>93% on 2l nasal cannula. Patient wear CPAP for OSA Incisions X5 c/d/i. Celebrex 200mg  and Heparin 7500U given pre-op at 8a. 1g Tylenol given also at 8am. 8mg  Decadron given @8 :57 in the OR. No further issues. Will continue to monitor and send to floor when ready.Contact RN (name and phone number): [employer] Rn

## 2020-05-11 NOTE — Plan of Care
Admission Note Nursing Bethany Wiggins is a 60 y.o. female admitted with a lap sleeve. [facility] PACUPatient is  Level of Consciousness: alert Vitals:  05/11/20 1115 05/11/20 1120 05/11/20 1125 05/11/20 1138 BP: (!) 143/84   (!) 149/89 Pulse: 71 71  69 Resp: (!) 12 18  18  Temp: 97.2 ?F (36.2 ?C)   97.5 ?F (36.4 ?C) TempSrc: Temporal   Axillary SpO2: 94% 94%  95% Weight:   (!) 143.9 kg  Height:    5' 3 (1.6 m) Oxygen therapy Oxygen TherapySpO2: 95 %Device (Oxygen Therapy): nasal cannulaO2 Flow (L/min): 2I have reviewed the patient's current medication orders..Comments: pt arrived to unit via stretched and oriented to unit, aox4, VSS, sating 93-94% of 2L NC, SBA oob, last bm 1/26 (per patient), due to void, x5 lap sites w/gauze and tegaderm c/d/I, pt c/o pain upon arrival to unit, PRN oxycodone given with positive effect, pt c/o nausea this afternoon, PRN zofran given with positive effect, IVF running, tolerating stage 1 bari diet well, tele tracing NSR, pills whole, call bell in reach, hourly rounding, safety maintained, will continue to monitor3:43 PM Flavia Shipper, RN Pt ambulated in hallway, pt sating 93% on RA, weaned off O2, and voiding 250 ml4:46 PM Pt sating between 87-90% on RA after ambulating, pt placed on 1L NC to maintain goal of >93% saturation5:24 PM See flowsheets, patient education and plan of care for additional information. Plan of Care Overview/ Patient Status

## 2020-05-11 NOTE — Anesthesia Post-Procedure Evaluation
Anesthesia Post-op NotePatient: Bethany Lah WilsonProcedure(s):  Procedure(s) (LRB):eras laparoscopic sleeve gastrectomy, egd (N/A) Patient location: PACULast Vitals:  I have noted the vital signs as listed in the nursing notes.Mental status recovered: patient participates in evaluation: YesVital signs reviewed: YesRespiratory function stable:YesAirway is patent: YesCardiovascular function and hydration status stable: YesPain control satisfactory: YesNausea and vomiting control satisfactory:Yes No complications documented.

## 2020-05-11 NOTE — Anesthesia Pre-Procedure Evaluation
This is a 60 y.o. female scheduled for eras laparoscopic sleeve gastrectomy (N/A ).Review of Systems/ Medical HistoryPatient summary, nursing notes, EKG/Cardiac Studies , Labs, pre-procedure vitals, height, weight and NPO status reviewed.No previous anesthesia concernsAnesthesia Evaluation:   No history of anesthetic complications ( during colonoscopy w propofol, Pt with O2 desat; nasal trumpet #32 placed to right nare, no resistance met, placed with ease; pt exchanging, jaw thrust needed; sedation turned off for remainder of procedure)  Estimated body mass index is 58.46 kg/m? as calculated from the following:  Height as of 05/05/20: 5' 3 (1.6 m).  Weight as of 05/05/20: 149.7 kg. CC/HPI: History obtained over the phone.60 year old female with PMH significant for   DM, HTN, OSA on CPAP, Afib (no AC) and BMI 58 presenting for  eras laparoscopic sleeve gastrectomy on 05/11/2020 with Dr. Gwenevere Abbot. Anesthesia type: GENERALPast Surgical History:  Past Surgical History:No date: DENTAL SURGERY12/05/2017: SPLIT SLEEP STUDYcolonoscopyCardiovascular:Patient has a history of: hypertension. Patient has no history of hypercholesterolemia. -Exercise tolerance: >4 METS -Dysrhythmia(s): : atrial fibrillation ( 2006 in the setting of drug use. Off AC now Sinus)-Vascular Disease:   No history of aortic disease and deep vein thrombosis. -Other Cardiovascular:  Patient has no history of CHF.  Intermittent BLE likely dependent. Lasix daily.  Patient denies any history of coronary artery disease, heart failure,or valvular heart disease. Patient denies any chest pain,  shortness of breath, dyspnea on exertion, orthopnea, or paroxysmal nocturnal dyspnea Evaluated by Dr. Maylon Peppers office 01/17/2020 per noteone time episode of symptomatic AFib w/ RVR which occurred in 2006 when she was in [place] on a very hot day and was using cocaine and spontaneously converted and was previously on coumadin but self discontinued 10 years ago..She has since had an event monitor without a. Fib in 2019.Plan:She is low risk for low-intermediate risk surgery.  RCRI 0. Can proceed with surgery without furher cardiac testing or intervention Can hold ASA prior to surgery from a cardiac prospectiveShould hold metformin prior to surgery 2019 ECHO* Technically difficult study.* Normal left ventricular size, thickness, systolic function and wall motion. LVEF estimated by visual assessment was between 55-60%.  Normal diastolic function and filling pressures.* Normal right ventricular cavity size and systolic function.  Estimated right ventricular systolic pressure is 30 mmHg.* Atria are normal in size.* No significant valvular abnormalities.* No evidence of pericardial effusion.* Compared with the prior study, dated 11/14/2009, there are changes noted.  Mitral regurgitation no longer appreciated though technical differences preclude complete comparison.01/2020 EKGSinus rhythm7/2020 STRESS* Normal PET myocardial perfusion study following pharmacologic vasodilation with regadenoson and at rest.  Perfusion imaging was normal.  Left ventricular ejection fraction was normal with normal wall motion.  The left ventricle was normal in size.  Stress electrocardiogram was normal.  [place] performed for attenuation correction showed no coronary artery calcifications.  Non-cardiac findings of the [place] are described in a separate report from Radiology.  No prior study available for comparison.* Global myocardial flow reserve was normal at 4.24.Marland Kitchen Respiratory: The patient had a no recent URI -Obstructive Sleep apnea:   yes (not currently using d/t recall), CPAP use.  -Airway disorders: -Asthma: no-Nicotine Dependence: Nicotine dependence: former smoker.HEENT:-Throat:  Patient has no difficulty swallowing.-Mouth:   She has no mouth ulcers.-Maxillofacial: Patient does not have temporomandibular joint syndrome.Neuromuscular:  Patient has a history of no seizures.-Intracranial disorders:  She did not have a cerebrovascular accident-Muscle disorders: neuromuscular disease Skeletal/Skin:  -Joint and Skeletal Disorders: arthritis (knees)Gastrointestinal/Genitourinary: -Gastrointestinal Disorders:  Patient has GERD (occasional symptoms. Diet  controlled). Her GERD is well controlled.-Nutritional Disorders: Patient has has increased body weight- extreme obesity.-Renal Disorders:  She does not have renal insufficiency.. Hematological/Lymphatic: -Anemia: Patient has anemia and iron deficiency anemia.  -Coagulopathy:  She has no thrombocytopenia.Endocrine/Metabolic: -Diabetes mellitus:  Patient has diabetes mellitus (HGB AIC 6.4) type 2. Her diabetes is well controlled.-Thyroid Disorders:  Patient has no hypothyroidism.-Adrenal Disorders: Patient has a history of no history of steroid use. Behavioral/Psychiatric & Syndromes: Negative-Comments: Previous cocaine use Additional Findings: LABSH/H 11.6/37.7 PLTS 364K 3.7 creat 0.80Physical ExamCardiovascular:    normal exam  Rhythm: regularPulmonary:  normal exam  Patient's breath sounds clear to auscultationAirway:  Mallampati: ITM distance: >3 FBNeck ROM: fullOther Findings: No physical exam.Anesthesia PlanASA 3 The primary anesthesia plan is  general ETT. Standard monitors. Perioperative Code Status confirmed: It is my understanding that the patient is currently designated as 'Full Code' and will remain so throughout the perioperative period.Anesthesia informed consent obtained. Use of blood products: restricted    Consent Comment: Patient questioned about privacy and agrees to speak with family present.Team anesthesia and reliefs discussed.Studying to be a Jehovah's Witness.  Refuses  Blood at all cost  Per converstation with me.The post operative pain plan is per surgeon management.Plan discussed with CRNA.Anesthesiologist's Pre Op NoteI personally evaluated and examined the patient prior to the intra-operative phase of care.

## 2020-05-11 NOTE — Utilization Review (ED)
UM Status: Meets Inpatient Status, preauthorized for inpatient level of care, auth # [id number]

## 2020-05-11 NOTE — Other
Subsequent to invasive procedure I have reviewed the patient's past medical history, medications, and conducted a review of systems. There are no pertinent changes since the last history and physical.Physical exam:Neuro: AOx3Gen: Awake, alert, NADLungs: CTAB, regular rate, good respiratory effort, on RAHeart: RRRAbd: soft, non-tender, non-distended. Ext: warm, well perfused x4Plan: Booked and consented for laparoscopic sleeve gastrectomy with Dr. Carmela Hurt: Andi Hence, MD, PhD, PGY-2Mobile HB: [phone]/27/227:44 AM

## 2020-05-11 NOTE — Discharge Instructions
Discharge InstructionsThe staff at [facility] extend our congratulations on your completion of this step of your weight loss journey as you are discharged from the hospital.  Upon discharge, please follow these instructions to help you with continued success in the next stages. Activity:  Please follow your diet protocol as instructed. Keep well hydrated. Non-strenuous aerobic activity is encouraged (i.e. walking and treadmill). Be out of bed during the day, except for a short nap or rest period. Take frequent walks throughout the day. This will help to prevent blood clots. Do not sit for longer than 45 minutes to 1 hour while awake for about 4 to 6 weeks after surgery. You should also continue to use your incentive spirometer in the post- operative course to help keep your lungs open and to prevent pneumonia.Avoid strenuous activity such as heavy lifting, pushing, or pulling, until after your follow-up visit with your surgeon. You are, however, encouraged to start your exercise program as soon as you are discharged from the hospital! You will likely be sore for the first couple of weeks after surgery but walking and gradually increasing your activity level is essential for long term success after surgery! A personal trainer is a great option for assistance in navigating the gym and provides some extra motivation! In the immediate post-operative period, you should not engage in any heavy lifting (greater than a gallon of milk) for four-six weeks.  No driving for 69-62 days (especially not while on narcotic medications). As a rule of thumb, you should NEVER drink alcohol or drive while taking narcotic pain medications. Women in the childbearing years should strongly consider using contraception as weight loss frequently increases fertility and can result in unplanned pregnancy.  If you have any questions, please reach out to your surgeon to discuss this in more detail. Wound care: You may shower starting the day after surgery but do not submerge the wounds in water (no baths, hot tubs or swimming); the incisions can remain open to air. Gently cleanse them with soapy water and pat dry daily. Do not scratch, or apply lotion to the area. Observe the wound for proper healing. Call your doctor if you have any tenderness, redness or swelling. If you had a drain, cover the site as needed for drainage with a dry dressing.  The drainage should slow within a day or two. If you have small paper bandaids steri-strips, these will fall off on their own or will be removed in the office at your follow up visit.Diet: Stay on the step 3 until your post-op visit unless otherwise instructed. Step 3 is a protein based full liquid diet; you should drink 2-4 oz of high protein liquid per meal 3 to 5 times daily.  Please refer to the list given to you by your surgeon's office for a list of acceptable protein shakes. On step 3, your goal will be 48-64 ounces of calorie free, non-carbonated fluids per day in addition to the protein drinks. Examples include water, decaf coffee or tea, broth, sugar-free crystal light, and fruit flavored waters. Note: tolerance of very cold or very hot liquids is variable after surgery; this may result in nausea or vomiting.  Stop drinking before you feel full or feel increasing pressure in your chest!!Medication instructions:Medications do not need to be changed to liquid, opened, or crushed form. Some patients prefer liquid Tylenol and this is fine. If you are discharged home and have a lot of pills that you should  continue to take, we may ask that you space out the medications. Only take medications indicated on your discharge papers. Diabetes medications and diuretics are frequently discontinued after surgery so please be sure to confirm which medications you should be taking. You should always avoid NSAIDs (Advil, ibuprofen) unless directed. If you are discharged home with Percocet or Norco you should not take additional acetaminophen. You MUST take something to stop the acid production in your stomach, usually 6 months after surgery. This is typically a PPI ( examples are: Prevacid,Prilosec, Nexium) which is over the counter but if you already take something (such as Protonix, Prevacid, Nexium) you are welcome to continue taking these instead. If you take an antacid other than Pepcid Complete, you will also need to add a calcium supplement. Vitamin and Supplements?	Vitamins per outpatient Nutritionist recommendationFollow Up: Please follow up with your surgeon in 2 weeks. Call the office sooner if any of the following: -	You have bleeding, swelling, redness, increased warmth, increased drainage, more pain/discomfort from the wound-	You have temperature (fever) higher than 101 degrees Fahrenheit (F)-	You have trouble breathing, fast or slow heartbeat, dizziness that does not go away after resting for 15 minutes-	You are feeling more tired or weak than usual-	You have persistent nausea and vomiting, or worsening abdominal pain-	Be sure to call if you have specific diet needs or intolerances-	You should call if you take multiple injections of insulin or struggle with low blood sugar-	You are always welcome to call at any time with generalized concerns!Patient-Provider Communication & Telephone Calls After Hours:-	MyChart can be used to communicate electronically and securely with your medical care team.  Should you have a brief question or issue to discuss with your physician by phone, you may call the office during office hours (9am-5pm). The nursing staff or medical assistants may be asked to call you after discussing the matter with providers.  Considerable effort is made to resond to phone messages within 24 hours of their receipt.  It is preferable that the in-depth evaluation and treatment of medical problems be conducted during a scheduled visit (telehealth or in-person) where you can receive personal care and attention. -	After-hours, if you are experiencing a life threatening emergency, please call 911 for emergency medical assistance.  We are dedicated to providing care when you need it and are available for urgent calls after normal office hours and during the holidays by calling our office at [phone].  Our answering service will obtain your information and page the on-call physician.  Please note that occasionally the on-call physician is in the operating room and unable to return a call for several hours.  We ask that after hours calls be reserved for urgent issues only. Free Support GroupsSupport group attendance after surgery has been shown to help increase weight loss after surgery.  Check with your surgeon's office for time and dates of support groups available. Step 3 Protein Based Full Liquids - NO SOLID FOODFollow for 2 weeks (weeks 1 & 2 after surgery)- Continue until you see your surgeon**Do not upgrade your diet until advised by APRN, PA or MD**Instructions?	Drink 2-4oz of protein shake 3-5 times/day (meal times)?	Drink 4oz or more of water or sugar free beverage every hour in between proteino	Or drink 1oz every 15 minutes?	Drink at least 8oz of fluids with sodium daily (chicken broth, Propel)?	Aim for 60 grams of protein or more & 48-64oz+ water daily.Recommended protein shakesLook for whey protein isolate or soy protein isolate as the main ingredient.**Protein shakes can be found online through a variety of retailers, shop around  for the best prices!?	In general, your shake should be 200 calories or less, 5g of fat or less, less than 20g carbs and AT LEAST 20g of protein per serving. ?	If you mix your shake with skim or 1% milk, remember to add the protein grams to your total. For example 8oz skim or 1% milk = 8g protein, 90-110cals and 12g carbohydrate. Add this to the amounts contained in your protein powder.   ?	If you experience low blood sugar levels, try mixing your shake with skim or 1% milk for 12 grams added carbs or look for a shake with up to 20 grams of carbohydrate. NOTE : Shakes with sugar alcohols may cause diarrhea or abdominal discomfort (Sorbitol, Mannitol, Malitol, etc.)Refer to examples of protein shakes provided in the preop class you attended. If you have any questions regarding a specific shake. Please call the office and ask to speak to your dietitian. Fluids Allowed:?	Water?	Crystal light, sugar free drink mixes, Mio Drops?	Decaf tea or coffee (Decaf)?	Skim Milk/ 1% Milk, unsweetened soy milk, unsweetened almond milk, Fairlife ultra filtered milk?	Regular sodium Chicken broth, G2, Gatorade Zero, PowerAde Zero or Propel for added electrolytes?	Isopure  zero carb clear (high in protein)DO NOT DRINK:Sweetened drinks of any kind: Soda, Fruit Juice of any kind, Sweet Iced Tea, Lemonade, Fruit Punch, Whole or 2%MilkHelpful Hints and Tips?	Most issues at this stage are related to dehydration.  Be sure to continue drinking throughout the day and take a water bottle everywhere you go!?	Spread your shakes throughout the day (mimic meal & snack time) so that you maintain your energy levels!?	Eat 3-5 small meals per day?	Continue to sip on fluids throughout the day?	Only eat the foods that are allowed on your current diet step. Do not advance your diet even if you are feeling fine! Your new stomach is healing and eating the wrong foods too early can result in serious complications.General Rules1.	No carbonated beverages, no chewing gum, no drinking with strawsCarbonated beverages may cause abdominal discomfort, gas, and bloating; the bubbles may stretch the stomach pouch over time. For fresh breath try breath strips that dissolve on the tongue.  2.	Do not drink beverages with calories or sugarNo fruit juice, regular soda, sweetened iced tea, fruit punch, alcohol, etc.  They are loaded with calories and will slow your weight loss.  INSTEAD try- water, crystal light, unsweetened tea, fruit 2-O, Propel, Powerade Zero, or sugar free drink mixes.3.	Start an exercise routine and make a plan for after surgeryExercise is a requirement after surgery and is part of the expectations from your doctor in addition to a healthy diet.  Weight loss cannot be maintained without regular exercise. Preventing Dehydration After Weight Loss SurgeryNearly all body processes depend upon water for proper functioning. Getting enough fluids is important to helping you lose weight after weight loss surgery. Your goal in the first few weeks after surgery should be to 48-64 ounces per day of non-carbonated, calorie free beverages a day. Because your new stomach is smaller, this is hard to do in the first few days after surgery. Some signs of dehydration are nausea, vomiting, headache, dizziness, dry mouth, and dark or cloudy urine. If you have any of these symptoms you need to drink more!! Here are some tips to help you:?	Keep track of how much you drink every day.  Notice how you feel if your total is less than 30-40 ounces a day. Do you have a headache, feel nauseated, feel tired, or even dizzy?  These are early signs of dehydration and mean that you should try to  drink more fluid. ?	Water is the best source of fluid intake. Tea and coffee will cause you to lose some fluid through increased urination, so avoid or limit these liquids. Otherwise flavored water, water with lemon, crystal light, are all good choices of beverages. ?	Liquids can be room temperature, warm or cold, whichever you find you tolerate best. ?	Try to keep a bottle of water or glass near you and take small sips throughout each hour. Eventually you should be able to drink a cup of water per hour, but start slow and work up!?	If you feel ?heartburn? when you drink liquids, you may be drinking them ?too fast?. Remember, food and liquids empty into your stomach more slowly now, and it may feel like reflux. Certain liquids may be worse, try something different, or vary temperature and see if that helps.?	Remember when the weather is hot and humid, your fluid needs will increase, so drink additional fluids. ?	If you have any questions, please call your surgeon's office and discuss with the dietitian or nursing staff.Preventing Post-Operative Constipation             1. Continue to take 1-2 Senna plus daily until bowel movement is achieved.            2. If NO bowel movement by post-operative day #3, please add 1 cap Miralax daily until bowel movement is achieved             3. If NO bowel movement by post-operative day # 4 please call the office Novel Coronavirus (COVID-19)Information for patients who are being discharged from the hospital after surgeryAbout the Coronavirus (COVID-19)?	COVID-19 is a viral illness caused by a type of coronavirus that was first discovered in [place] at the end of 2019. Researchers believe that the COVID-19 virus is most likely a zoonotic infection which means that it made the jump from animals to humans.?	There is currently no vaccine to prevent coronavirus disease 2019 (COVID-19).?	The best way to prevent illness is to avoid being exposed to this virus.?	The virus is thought to spread mainly from person-to-person.o	Between people who are in close contact with one another (within about 6 feet).o	Through respiratory droplets produced when an infected person coughs or sneezes.o	Infectious secretions (respiratory droplets) may include sputum, blood, and respiratory droplets (for example, after a cough or a sneeze).o	These droplets can land in the mouths or noses of people who are nearby or possibly be inhaled into the lungs.o	Please note that you can carry the virus even if you do not have any symptoms. o	In many cases, patients will exhibit very mild symptoms, if any. However, some individuals can become critically ill. This includes patients who are not otherwise considered to be in the ?at risk? populations. -	At risk populations include older adults and people who have chronic medical conditions (such as heart or lung disease, diabetes, or who are immunocompromised). Note, we ALL can play a role in halting the spread of this disease! Below is some information on how you can help protect yourself and those around you. The best way to prevent the spread of this disease is hand hygiene and social distancing.?	HAND HYGIENE:o	Perform hand hygiene with alcohol-based hand rub or soap and water frequently. You should perform hand hygiene especially after blowing your nose, coughing, sneezing, an using the bathroom. You should also wash your hands before preparing food and before eating. You should perform this hand hygiene even if you are not sick or do not have any sick contacts. o	Additionally, do not share glasses, eating utensils, or towels. o	Clean frequently  utilized/touched surfaces at least daily. For example, table tops, doorknobs, toilets, cellphones, keyboards, etc should be wiped down at least daily. o	Avoid touching your face, mouth, eyes, or nose, especially with unwashed hands.o	If you do not have any commercial disinfectant at home, you can make some using household bleach:-	Mix 5 tablespoons of bleach in a gallon of water -	Alternatively, mix 4 teaspoons of bleach per quart of water-	UNEXPIRED bleach is effective against the coronavirus when properly diluted. ?	SOCIAL DISTANCING o	Stay home if you can. This includes any members of your household. If your partner/roommate/child leaves the home, s/he risks bringing the virus home to you.  o	Try and only leave your home to perform essential tasks such as picking up medication or groceries. When you return from these tasks, be sure to complete thorough hand hygiene. Avoid non-essential tasks such as casual shopping, going to the movies, etc. o	If you take medications daily, it may be helpful to ask your doctor to prescribe a three-month supply so you will not need to make as many trips to the pharmacy. If your pharmacy offers drive through or delivery options, it may be best to use this whenever possible. o	Pick up food or order out whenever possible. Stock up on essentials but do not hoard supplies!o	Avoid crowds or large groups of people (more than 10 people).o	Keep your distance! Try to stay 6 feet away from other individuals ESPECIALLY if they are potentially ill.o	Avoid individuals who are suspected to have been exposed to COVID-19. ?	NOTE ABOUT MASKSo	Masks help prevent those who are infected from spreading disease but they do not prevent you from getting ill. In fact, those who are not accustomed to wearing masks will touch their faces more frequently putting you at greater risk of acquiring the disease. Recovering from surgery during the COVID-19 Outbreak?	After you have surgery, we often advise that you try and get back to your usual routine as best as possible as this typically helps facilitate healing. Each surgery and surgeon is different so be sure to follow your surgeon's specific instructions. ?	After surgery, your body can be particularly vulnerable as it is in 'overdrive' healing from the operation/procedure you had done.?	In addition to practicing good hygiene and social distancing, you should pay attention to your diet. Regular follow up with your surgeon and dietitian is important for long term success. Since surgery decreases your appetite so it is especially important that you try to eat nutrient rich foods and avoid ? junk food? or empty calories. You should drink plenty of water to stay hydrated and take your vitamins as recommended. Zinc, in some studies, seems to slow virus replication. If you are taking a multivitamin recommended by our program, it should have adequate amounts of zinc and further supplementation isn't needed.?	While social distancing is one of the best ways to prevent disease spread, this does not mean you cannot be active.  After most surgeries, walking is advised as it helps you recover faster and prevents blood clots or DVTs. If you are able to go outside for short walks make sure to stay 6 feet away from anybody else that may be walking. With that being said, I would avoid walking in shopping malls or other potentially crowded public places. Do not go to the gym right now. Even if going outside to weak is walk is not possible you can easily march in place throughout your day. What are the symptoms of COVID-19?Unlike the flu or common cold, the symptoms of COVID-19 tend to be more gradual. The most common symptoms are fever,  cough and body aches. The elderly, immunocompromised and people with lung or heart disease are at the highest risk of becoming very ill. What to do if you think you or a loved one may be sick?For any questions, please call [facility] COVID-19 Call Center at [phone], or toll-free at [phone].  Stay at home if you are sick. If you have questions about home quarantine or COVID-19, call the COVID-19 Call Center: [phone]. Please call ahead before visiting your doctor or any other healthcare facility.Effective protection: Wash your hands, cover your mouth when coughing or sneezing. This is the best way to prevent spread of the disease. You should wear a surgical facemask. Cough/sneeze into your elbow. Seek further testing only if instructed by your doctor. Not everyone can or should be tested for COVID-19. Before you seek further testing, call the COVID-19 Call Center at [phone] for guidance. If you go to another emergency room or clinic for testing without guidance, you risk exposing yourself and others.  Come to the emergency room if you develop difficulty breathing or other severe symptoms. Call 911 for guidance first, mention your worsening COVID-19 symptoms, and return by private car or ambulance. Avoid using public transportation to limit exposure to others. Do NOT visit another healthcare facility without speaking with 911, your doctor or the COVID-19 Call Center.  Where can I find more information about COVID-19?Since this is a new disease caused by a new virus, scientists and researchers are discovering new information every day. We strongly encourage you to check for updates from these official sources:-	The Centers for Disease Control and Prevention (CDC) COVID-19 o	http://bradshaw.com/-	The World Health Organization (WHO) Q&A on COVID-19o	[employer].com.cy-	[facility] o	https://barnes.org/.aspx-	[facility]                KeywordPortfolios.com.br

## 2020-05-11 NOTE — Other
Post Anesthesia Transfer of Care NotePatient: Bethany Lah WilsonProcedure(s) Performed: Procedure(s) (LRB):eras laparoscopic sleeve gastrectomy, egd (N/A) Patient location: PACU Last Vitals: Vitals Value Taken Time BP 153/81 05/11/20 1030 Temp 36.1 ?C 05/11/20 1030 Pulse 69 05/11/20 1031 Resp 16 05/11/20 1031 SpO2 88 % 05/11/20 1031 Vitals shown include unvalidated device data.Level of consciousness: awakeTransport Vital Signs:  Stable since the last set of recorded intra-operative vital signsIntra-operative Complications: noneIntra-operative Intake & Output and Antibiotics as per Anesthesia record and discussed with the RN.

## 2020-05-11 NOTE — Other
Operative Diagnosis:Pre-op:   Morbid obesity (HC Code) (HC CODE) [E66.01] Patient Coded Diagnosis   Pre-op diagnosis: Morbid obesity (HC Code) (HC CODE)  Post-op diagnosis: Morbid obesity (HC Code) (HC CODE)  Patient Diagnosis   Pre-op diagnosis: Morbid obesity (HC Code) (HC CODE) [E66.01]  Post-op diagnosis:     Post-op diagnosis:   * Morbid obesity (HC Code) (HC CODE) [E66.01]Operative Procedure(s) :Procedure(s) (LRB):eras laparoscopic sleeve gastrectomy, egd (N/A)Post-op Procedure & Diagnosis ConfirmationPost-op Diagnosis: Post-op Diagnosis confirmed (no changes)Post-op Procedure: Post-op Procedure confirmed (no changes)

## 2020-05-11 NOTE — Other
POST OP CHECK Bethany Wiggins is a 60 y.o. female patient POD#0 s/p laparoscopic sleeve gastrectomy with Dr. Clarita Crane Diagnosis: morbid obesityPast Medical History: Diagnosis Date ? A-fib (HC Code) (HC CODE)  ? Anemia  ? Arthritis  ? CPAP (continuous positive airway pressure) dependence   has c-pap; doesnt use it ;  too much pressure ? Diabetes mellitus (HC Code) (HC CODE)  ? GERD (gastroesophageal reflux disease)  ? High cholesterol  ? Hypertension  ? Iron deficiency  ? Neuropathy (HC Code)  ? Obstructive sleep apnea  No past surgical history pertinent negatives on file.Subjective:Afebrile, no acute events. Moderate epigastric pain without radiation. Mild nausea with PO intake. Feeling tired- denies chest pain, dyspnea, palpitations- pain well controlledObjective:Temp:  [97 ?F (36.1 ?C)-97.5 ?F (36.4 ?C)] 97.5 ?F (36.4 ?C)Pulse:  [68-74] 69Resp:  [11-22] 18BP: (132-153)/(79-89) 149/89SpO2:  [94 %-98 %] 95 %Device (Oxygen Therapy): nasal cannulaO2 Flow (L/min):  [2] 2Intake/Output Summary (Last 24 hours) at 05/11/2020 1438Last data filed at 05/11/2020 1221Gross per 24 hour Intake 1205 ml Output 20 ml Net 1185 ml No results found for requested labs within last 7 days. Lab Results Component Value Date  CREATININE 0.80 11/30/2019  BUN 11 11/30/2019  NA 138 11/30/2019  K 3.7 11/30/2019  CL 94 (L) 11/30/2019  CO2 31 (H) 11/30/2019 Physical Exam: Gen: awake, alert, NAD, resting comfortably in bedHeent: EOMI, trachea midline, neck flatLungs: CTAB on 2L NCChest: RRR no murmurs, rubs, or gallops auscultatedAbd: soft, attp, nondistended, incisions CDIExt: WWP, moving all extremities, venodynes in place, no edema notedNeuro: AOX4A/P:Bethany Wiggins is a 60 y.o. female patient POD#0 s/p laparoscopic sleeve gastrectomy with Dr. Gwenevere Abbot. Patient is postoperatively stable. Diet: step 1Fluids: mIVFPain control: ATC tylenol, oxy ssGI PPX: pepcidENDO: insulinDVT/PE PPX: HSQ, SCDs, IS, ambulationDue to void: @ 4:15pmMonitor I/O Q4HMonitor vital signs Q4HAM labsHome meds as appropriateDispo: pending clinical courseAppreciate Nursing Care!Claiborne Rigg Department of SurgeryPager: [phone]

## 2020-05-12 LAB — BASIC METABOLIC PANEL
BKR ANION GAP: 11 (ref 7–17)
BKR BLOOD UREA NITROGEN: 8 mg/dL (ref 6–20)
BKR BUN / CREAT RATIO: 13.3 (ref 8.0–23.0)
BKR CALCIUM: 9 mg/dL (ref 8.8–10.2)
BKR CHLORIDE: 96 mmol/L — ABNORMAL LOW (ref 98–107)
BKR CO2: 29 mmol/L (ref 20–30)
BKR CREATININE: 0.6 mg/dL (ref 0.40–1.30)
BKR EGFR (AFR AMER): >60 mL/min/{1.73_m2} (ref >60)
BKR EGFR (NON AFRICAN AMERICAN): >60 mL/min/{1.73_m2} (ref >60)
BKR GLUCOSE: 95 mg/dL (ref 70–100)
BKR POTASSIUM: 3.3 mmol/L (ref 3.3–5.3)
BKR SODIUM: 136 mmol/L (ref 136–144)

## 2020-05-12 LAB — CBC WITH AUTO DIFFERENTIAL
BKR WAM ABSOLUTE IMMATURE GRANULOCYTES.: 0.04 x 1000/ÂµL (ref 0.00–0.30)
BKR WAM ABSOLUTE LYMPHOCYTE COUNT.: 1.26 x 1000/ÂµL (ref 0.60–3.70)
BKR WAM ABSOLUTE NRBC (2 DEC): 0 x 1000/ÂµL (ref 0.00–1.00)
BKR WAM ANALYZER ANC: 6.74 x 1000/ÂµL (ref 2.00–7.60)
BKR WAM BASOPHIL ABSOLUTE COUNT.: 0.01 x 1000/ÂµL (ref 0.00–1.00)
BKR WAM BASOPHILS: 0.1 % (ref 0.0–1.4)
BKR WAM EOSINOPHIL ABSOLUTE COUNT.: 0 x 1000/ÂµL (ref 0.00–1.00)
BKR WAM EOSINOPHILS: 0 % (ref 0.0–5.0)
BKR WAM HEMATOCRIT (2 DEC): 36.6 % (ref 35.00–45.00)
BKR WAM HEMOGLOBIN: 11.4 g/dL — ABNORMAL LOW (ref 11.7–15.5)
BKR WAM IMMATURE GRANULOCYTES: 0.5 % (ref 0.0–1.0)
BKR WAM LYMPHOCYTES: 14.7 % — ABNORMAL LOW (ref 17.0–50.0)
BKR WAM MCH (PG): 24.7 pg — ABNORMAL LOW (ref 27.0–33.0)
BKR WAM MCHC: 31.1 g/dL (ref 31.0–36.0)
BKR WAM MCV: 79.4 fL — ABNORMAL LOW (ref 80.0–100.0)
BKR WAM MONOCYTE ABSOLUTE COUNT.: 0.54 x 1000/ÂµL (ref 0.00–1.00)
BKR WAM MONOCYTES: 6.3 % (ref 4.0–12.0)
BKR WAM MPV: 9.3 fL (ref 8.0–12.0)
BKR WAM NEUTROPHILS: 78.4 % — ABNORMAL HIGH (ref 39.0–72.0)
BKR WAM NUCLEATED RED BLOOD CELLS: 0 % (ref 0.0–1.0)
BKR WAM PLATELETS: 327 x1000/ÂµL (ref 150–420)
BKR WAM RDW-CV: 16 % — ABNORMAL HIGH (ref 11.0–15.0)
BKR WAM RED BLOOD CELL COUNT.: 4.61 M/ÂµL (ref 4.00–6.00)
BKR WAM WHITE BLOOD CELL COUNT: 8.6 x1000/ÂµL (ref 4.0–11.0)

## 2020-05-12 LAB — MAGNESIUM: BKR MAGNESIUM: 1.9 mg/dL (ref 1.7–2.4)

## 2020-05-12 LAB — PHOSPHORUS     (BH GH L LMW YH): BKR PHOSPHORUS: 3.1 mg/dL (ref 2.2–4.5)

## 2020-05-12 MED ORDER — POTASSIUM CHLORIDE ER 20 MEQ TABLET,EXTENDED RELEASE(PART/CRYST)
20 MEQ | Freq: Once | ORAL | Status: CP
Start: 2020-05-12 — End: ?
  Administered 2020-05-12: 14:00:00 20 MEQ via ORAL

## 2020-05-12 MED ORDER — MAGNESIUM SULFATE 2 GRAM/50 ML (4 %) IN WATER INTRAVENOUS PIGGYBACK
2 gram/50 mL (4 %) | Freq: Once | INTRAVENOUS | Status: CP
Start: 2020-05-12 — End: ?
  Administered 2020-05-12: 15:00:00 2 mL/h via INTRAVENOUS

## 2020-05-12 MED ORDER — POLYETHYLENE GLYCOL 3350 17 GRAM ORAL POWDER PACKET
17 gram | Freq: Every day | ORAL | 3 refills | Status: DC | PRN
Start: 2020-05-12 — End: 2020-07-06

## 2020-05-12 MED ORDER — OMEPRAZOLE 20 MG CAPSULE,DELAYED RELEASE
20 mg | ORAL_CAPSULE | Freq: Every day | ORAL | 2 refills | Status: AC
Start: 2020-05-12 — End: 2020-09-18

## 2020-05-12 MED ORDER — SENNOSIDES 8.6 MG TABLET
8.6 mg | ORAL_TABLET | Freq: Two times a day (BID) | ORAL | 12 refills | Status: DC
Start: 2020-05-12 — End: 2020-07-06

## 2020-05-12 MED ORDER — ACETAMINOPHEN 325 MG TABLET
325 mg | ORAL_TABLET | Freq: Four times a day (QID) | ORAL | 12 refills | Status: AC
Start: 2020-05-12 — End: ?

## 2020-05-12 NOTE — Plan of Care
Plan of Care Overview/ Patient Status    Pt a;ert and oriented x4. On 1L but was desating so moved up to 2L and later CPAP. Pt is seen ambulate indptly on RA and despite tele alert was above 94% on Vital signs monitor so source of pulseOx changed back from left ear to left fingers and was of good effect. VSS except above. Tolerating step 1 diet though given zofran x1 after swallowing Tylenol wanted to cause nausea. Pain mgmt with Oxy 5mg  and 10mg  based on pain level. Voiding independently in the bathroom assistance is with SCD. Using SCD as ordered and IS with much understanding.On Tele and NSR. LR @125  infusing. % lapsites with guaze and tagaderm. Report given to next shift.

## 2020-05-12 NOTE — Plan of Care
Problem: Adult Inpatient Plan of CareGoal: Readiness for Transition of CareOutcome: Interventions implemented as appropriate Plan of Care Overview/ Patient Status    60 y.o F who presented to [facility] for scheduled eras laparoscopic sleeve gastrectomy, egd on 05/11/2020. CM completed IA. Patient was independent w/ care needs prior to admission. No needs on dc. Family to transport home vs taxi benefits. No further discharge planning required. Case Management Evaluation    Most Recent Value Case Management Evaluation and Plan Arrived from prior to admission home/apartment/condo Services Prior to Admission none Prior to Hospitalization: Assistance Needed/DME being used Respiratory Documented Insurance Accurate Yes  Eilene Ghazi D] Patient's home address verified Yes Patient's PCP of record verified Yes  [Lawlor, Anson [place], DO] Source of Clinical History Patient's clinical history has been reviewed and source of Information is: Patient, Medical Provider, Medical record Case Manager Attestation: Choose which ONE is appropriate for you I have reviewed the medical record and completed the above evaluation with the following recommendations. Yes Discharge Planning Coordination Recommendations Discharge Planning Coordination Recommendations Home with MD follow-up/No needs identified Case Manager reviewed plan of care/ continuum of care need's with  Interdisciplinary Team, Transitional care rounds, Patient  Alverda Skeans BSN RN Care Manager [facility] [phone])

## 2020-05-12 NOTE — Progress Notes
[place] [facility] MIS Progress NoteAttending Provider: Dorann Ou, MD01/28/22Admit Date: 1/27/2022Hospital Day: 21 Day Post-OpProcedure(s) (LRB):eras laparoscopic sleeve gastrectomy, egd (N/A)Admission History  60 y.o. female patient POD#1 s/p laparoscopic sleeve gastrectomy with Dr. Gwenevere Abbot on 05/11/2020. Subjective/Interim History: O/N:- no acute events overnight- pain well controlled with medicationsPast 24H:- voiding, UOP @ 950, x 4- Step 1 diet, w/o n/v- mIVF: LR running @ 125 ml/hrObjective: VitalsTemp:  [97 ?F (36.1 ?C)-97.9 ?F (36.6 ?C)] 97.7 ?F (36.5 ?C)Pulse:  [50-72] 56Resp:  [11-22] 18BP: (117-153)/(72-89) 117/72SpO2:  [90 %-98 %] 98 %RAIOsI/O last 3 completed shifts:In: 1815 [P.O.:640; I.V.:1175]Out: 970 [Urine:950; Blood:20]Physical Exam: General: awake, lying comfortably in bedNeuro: talkative, moving all 4 extremities spontaneously Pulm: no wheezing/stridor, no abnormal breath sounds, breathing comfortably on RACardio: RRRABD: soft, no pain upon palpation in all 4 quadrants; no distension; no tenderness; incisions c/d/iExt: warm, well perfusedSkin: no rashes notedLabs Labs: Recent Labs Lab 01/28/220558 WBC 8.6 HGB 11.4* HCT 36.60 PLT 327  Recent Labs Lab 01/28/220558 NEUTROPHILS 78.4*  Recent Labs Lab 01/28/220558 NA 136 K 3.3 CL 96* CO2 29 BUN 8 CREATININE 0.60 GLU 95 ANIONGAP 11  Recent Labs Lab 01/28/220558 CALCIUM 9.0 MG 1.9 PHOS 3.1  No results for input(s): ALT, AST, ALKPHOS, BILITOT, BILIDIR in the last 168 hours. No results for input(s): PTT, LABPROT, INR in the last 168 hours. Diagnostics: No results found.ECG/Tele: NoneAssessment / Plan  59 y.o. female patient POD#1 s/p laparoscopic sleeve gastrectomy with Dr. Gwenevere Abbot on 05/11/2020. The patient continues to be doing well in the post-operative period and is meeting appropriate milestones.Plan:- continue current pain management- step 3 bariatric diet- DC mIVFDipso: possible DC to home tomorrow, 1/29/22Please refer to resident and attending addendum for corrections and final plan. Signed:Elwyn Klosinski Chalmers Cater, MDGeneral Surgery Service, Categorical PGY1January 28, 20229:31 AMAttending Addendum:

## 2020-05-12 NOTE — Plan of Care
Plan of Care Overview/ Patient Status    Inpatient Physical Therapy Evaluation IP Adult PT Eval/Treat - 05/12/20 0840    Date of Visit / Treatment  Date of Visit / Treatment 05/12/20   Note Type Evaluation   Progress Report Due 05/26/20   Start Time 800   End Time 840   Total Treatment Time 40    General Information  Pertinent History Of Current Problem Pt is a 60 yo F admitted on 05/11/20 for morbid obesity. s/p laparoscopic sleeve gastrectomy 05/11/20. PMHx significant for A-fib, anemia, CPAP, DM, High cholesterol, HTN, neuropathy, OSA. Satting 87-90 on 1L, sat goal >93%.   Subjective I cook a lot. But I didn't get this big by smelling food, I'll tell you that much right now   General Observations Pt found supine in bed, CPAP in place (removed with nursing), alert, agreeable to PT.   Precautions/Limitations fall precautions    Weight Bearing Status  Weight Bearing Status WNL - Within normal limits    Prior Level of Functioning/Social History  Additional Comments Pt lives in a multi-family home, apt on second floor with a full flight to her apt, no elevator, + railing, Pt takes stairs 1 at a time at baseline. No elevator. Pt uses a cane PRN rarely for knee pain. 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 Pt has no difficulty performing these tasks at baseline, and is fully (I) with ADLs and IADLs.    Vital Signs and Orthostatic Vital Signs     Pre Treatment SpO2 (%) 96      Pre Treatment O2 Delivery room air   During Treatment SpO2 (%) 93   During Treatment O2 Delivery room air      Post Treatment SpO2 (%) 95      Post Treatment O2 Delivery room air    Pain/Comfort  Pain Comment (Pre/Post Treatment Pain) Min pain noted in L abdomen, RN aware    Patient Coping  Observed Emotional State accepting    Cognition  Overall Cognitive Status WFL   Orientation Level Oriented to person;Oriented to place;Oriented to time;Oriented to situation   Cognition Comments Good historian, pleasant demeanor    Range of Motion  Range of Motion Examination bilateral upper extremity ROM was WFL;bilateral lower extremity ROM was [employer] [facility]    Manual Muscle Testing  Manual Muscle Testing Results No strength deficits were identified    Muscle Tone  Muscle Tone Testing Results No muscle tone deficits noted    Neurologic  Neurologic WDL WDL    Coordination  Coordination Comments No deficits noted    Sensory Assessment  Sensory Tests Results No sensory impairment noted    Skin Assessment  Skin Assessment See Nursing Documentation    Posture, Head/Trunk Alignment  Posture, Head/Trunk Alignment Maintains midline    Balance  Sitting Balance: Static  GOOD-    Maintains static position against minimal resistance with no Assistive Device   Sitting Balance: Dynamic  GOOD-   Independent in dynamic balance activities (ambulates on level surfaces with no Assistive Device, picks up object from floor in sitting)   Standing Balance: Static GOOD-    Maintains static position against minimal resistance with no Assistive Device   Standing Balance: Dynamic  GOOD-   Independent in dynamic balance activities (ambulates on level surfaces with no Assistive Device, picks up object from floor in sitting)   Balance Assist Device Rolling walker  Bed Mobility  Symptoms Noted During/After Treatment none   Rolling/Turning Left - Independence/Assistance Level Modified independence   Rolling/Turning Assist Device Head of bed elevated;Bed rails   Supine-to-Sit Independence/Assistance Level Complete independence   Supine-to-Sit Assist Device Head of bed elevated   Bed Mobility Comments Pt (I) with bed mobility, educated how to do Log roll    Sit-Stand Transfer Training  Symptoms Noted During/After Treatment Marketing executive) none Sit-to-Stand Transfer Independence/Assistance Level Contact guard   Sit-to-Stand Transfer Assist Device Rolling walker   Stand-to-Sit Transfer Independence/Assistance Level Supervision   Stand-to-Sit Transfer Assist Device IV pole   to sit on toilet  Sit-Stand Transfer Comments Good independence   Stand-Sit Transfer Comments Good eccentric control    Gait Training  Symptoms Noted During/After Treatment  none   Independence/Assistance Level  Contact guard   Assistive Device  Rolling walker   Gait Distance 25 feet;x3   Gait Training Comments Pt slightly slowed but steady, demonstrates effective use of RW    Handoff Documentation  Handoff Comments Handed off care to nursing with patient on toilet    Endurance  Endurance Comments good-    PT- AM-PAC - Basic Mobility Screen- How much help from another person do you currently need.....  Turning from your back to your side while in a a flat bed without using rails? 4 - None - Does not require any help and does the activity independently. Can use assistive devices.   Moving from lying on your back to sitting on the side of a flat bed without using bed rails? 4 - None - Does not require any help and does the activity independently. Can use assistive devices.   Moving to and from a bed to a chair (including a wheelchair)? 3 - A Little - Requires a little help (supervision, minimal assistance). Can use assistive devices.   Standing up from a chair using your arms(e.g., wheelchair or bedside chair)? 3 - A Little - Requires a little help (supervision, minimal assistance). Can use assistive devices.   To walk in a hospital room? 3 - A Little - Requires a little help (supervision, minimal assistance). Can use assistive devices.   Climbing 3-5 steps with a railing? 3 - A Little - Requires a little help (supervision, minimal assistance). Can use assistive devices.   AMPAC Mobility Score 20   TARGET Highest Level of Mobility Mobility Level 6, Walk 10+steps    Clinical Impression  Initial Assessment Pt is a 60 yo F admitted on 05/11/20 for morbid obesity. s/p laparoscopic sleeve gastrectomy 05/11/20. Pt presents with slight functional mobility deficits in her balance and gait, but appears to be at baseline. Pt was able to walk 25x3 with RW and CGA, no LOB, no s/s fatigue. Pt is a good candidate for home with no services necessary at this time. Recommend RW on dc to improve functional safety and independence.   Criteria for Skilled Therapeutic Interventions Met no;current level of function same as previous level of function    Patient/Family Stated Goals  Patient/Family Stated Goal(s) return home    Frequency/Equipment Recommendations  PT Frequency Cleared   Equipment Needs During Admission/Treatment Rolling walker    PT Recommendations for Inpatient Admission  Activity/Level of Assist assist of 1;with rolling walker;in hall   (I) in room   Planned Treatment / Interventions  Education Treatment / Interventions Patient Education / Training   Role of PT, POC, dispo   PT Discharge Summary  Physical Therapy Disposition Recommendation Home  Additional Physical Therapy Disposition Recommendations No follow up therapy needs   Equipment Recommendations for Discharge Rolling walker      Verl Dicker, SPT

## 2020-05-12 NOTE — Plan of Care
Bethany Wiggins					Location: MAIN 6 [id number] y.o., female				Attending: Dorann Ou, MD	Admit Date: 05/11/2020			[id number] LOS: 1 day Nutrition NoteChart reviewed and patient interviewed based on bariatric surgery protocol. Met with the patient at the bedside to review discharge diet. The patient understands that they will follow the Step 3 Bariatric Diet upon discharge home until the 2 week follow up appointment with the surgeon and Registered Dietitian. Reinforced the importance of adequate protein intake (60 gm minimum per day) and the importance of adequate hydration (64 oz per day). All questions addressed and the patient had no additional questions regarding their diet at this time.Anthropometrics- Height: 5' 3 (1.6 m)- Current weight: (!) 143.9 kg- BMI: Body mass index is 56.2 kg/m?.- IBW: 52.3 kg Estimated Nutrition Requirements:Kcal/day: 1983		(RMR)Protein/day: 60-80 grams/day minimum per [facility] Bariatric guidelinesFluids/day: 64 oz per day per [facility] Bariatric guidelinesNeeds based on: Age, 143.9 kg, s/p bariatric surgeryNutrition Diagnosis: No nutrition diagnosis at this timeNutrition Interventions: ?	Meal and Snacks: Modify distribution, type or amount of food and nutrients within meals or at specified times: Per team discretion Bariatric Step 3 diet?	Commercial Beverage: Ensure Max per [facility] Bariatric Surgery ProtocolMonitoring/Evaluation: Food/Nutrition-Related Outcomes:            Food and nutrient intake/administrationAnthropometric Outcomes:            WeightNutrition-Focused Physical Finding Outcomes:            Physical appearance, muscle and fat wastingRD following per [facility] Clinical Nutrition Standards of Care.Registered Dietitian: Maximino Greenland RD. CDN.[facility] [phone] 6 [facility] [phone] Phone [phone]/Holiday [facility] [phone]/28/2022

## 2020-05-13 LAB — CBC WITH AUTO DIFFERENTIAL
BKR WAM ABSOLUTE IMMATURE GRANULOCYTES.: 0.01 x 1000/ÂµL (ref 0.00–0.30)
BKR WAM ABSOLUTE LYMPHOCYTE COUNT.: 1.73 x 1000/ÂµL (ref 0.60–3.70)
BKR WAM ABSOLUTE NRBC (2 DEC): 0 x 1000/ÂµL (ref 0.00–1.00)
BKR WAM ANALYZER ANC: 4.4 x 1000/ÂµL (ref 2.00–7.60)
BKR WAM BASOPHIL ABSOLUTE COUNT.: 0.03 x 1000/ÂµL (ref 0.00–1.00)
BKR WAM BASOPHILS: 0.4 % (ref 0.0–1.4)
BKR WAM EOSINOPHIL ABSOLUTE COUNT.: 0.12 x 1000/ÂµL (ref 0.00–1.00)
BKR WAM EOSINOPHILS: 1.8 % (ref 0.0–5.0)
BKR WAM HEMATOCRIT (2 DEC): 33.6 % — ABNORMAL LOW (ref 35.00–45.00)
BKR WAM HEMOGLOBIN: 10.6 g/dL — ABNORMAL LOW (ref 11.7–15.5)
BKR WAM IMMATURE GRANULOCYTES: 0.1 % (ref 0.0–1.0)
BKR WAM LYMPHOCYTES: 25.6 % (ref 17.0–50.0)
BKR WAM MCH (PG): 25.2 pg — ABNORMAL LOW (ref 27.0–33.0)
BKR WAM MCHC: 31.5 g/dL (ref 31.0–36.0)
BKR WAM MCV: 80 fL (ref 80.0–100.0)
BKR WAM MONOCYTE ABSOLUTE COUNT.: 0.48 x 1000/ÂµL (ref 0.00–1.00)
BKR WAM MONOCYTES: 7.1 % (ref 4.0–12.0)
BKR WAM MPV: 9.1 fL (ref 8.0–12.0)
BKR WAM NEUTROPHILS: 65 % (ref 39.0–72.0)
BKR WAM NUCLEATED RED BLOOD CELLS: 0 % (ref 0.0–1.0)
BKR WAM PLATELETS: 310 x1000/ÂµL (ref 150–420)
BKR WAM RDW-CV: 16 % — ABNORMAL HIGH (ref 11.0–15.0)
BKR WAM RED BLOOD CELL COUNT.: 4.2 M/ÂµL (ref 4.00–6.00)
BKR WAM WHITE BLOOD CELL COUNT: 6.8 x1000/ÂµL (ref 4.0–11.0)

## 2020-05-13 LAB — BASIC METABOLIC PANEL
BKR ANION GAP: 8 (ref 7–17)
BKR BLOOD UREA NITROGEN: 7 mg/dL (ref 6–20)
BKR BUN / CREAT RATIO: 10 (ref 8.0–23.0)
BKR CALCIUM: 9.1 mg/dL (ref 8.8–10.2)
BKR CHLORIDE: 100 mmol/L (ref 98–107)
BKR CO2: 32 mmol/L — ABNORMAL HIGH (ref 20–30)
BKR CREATININE: 0.7 mg/dL (ref 0.40–1.30)
BKR EGFR (AFR AMER): >60 mL/min/{1.73_m2} (ref >60)
BKR EGFR (NON AFRICAN AMERICAN): >60 mL/min/{1.73_m2} (ref >60)
BKR GLUCOSE: 90 mg/dL (ref 70–100)
BKR POTASSIUM: 3.5 mmol/L (ref 3.3–5.3)
BKR SODIUM: 140 mmol/L (ref 136–144)

## 2020-05-13 LAB — MAGNESIUM: BKR MAGNESIUM: 2.1 mg/dL (ref 1.7–2.4)

## 2020-05-13 LAB — PHOSPHORUS     (BH GH L LMW YH): BKR PHOSPHORUS: 2 mg/dL — ABNORMAL LOW (ref 2.2–4.5)

## 2020-05-13 MED ORDER — WATER FOR INJECTION, STERILE INJECTION SOLUTION
Status: DC
Start: 2020-05-13 — End: 2020-05-13

## 2020-05-13 MED ORDER — FAMOTIDINE 20 MG TABLET
20 mg | Freq: Two times a day (BID) | ORAL | Status: DC
Start: 2020-05-13 — End: 2020-05-14
  Administered 2020-05-14 (×2): 20 mg via ORAL

## 2020-05-13 NOTE — Plan of Care
Plan of Care Overview/ Patient Status    Pt A&Ox4. VSS. RA. Step 3 diet tolerated. Denies sob, chest pain, n/v/d. No respiratory distress noted. CPAP @ bedtime. Refusing scheduled Tylenol. PRN Oxy w + effect. Lap sites w gauze and tegaderm. IS. SCDs BL. OOB independently. Voiding spontaneously in the toilet. Meds per MAR. POC reviewed w pt who verbalized understanding. Safety maintained. Call bell within reach. Will continue to follow POC. Mio Schellinger da Bernerd Pho, RN

## 2020-05-13 NOTE — Progress Notes
24h Events:NAEOPain well controlledTolerating diet w/ 760 intakeOOBAmbulatingS:She is having no abdominal pain.She is having no nausea or vomiting. She is tolerating a diet.She is passing gas and is not having bowel movements.O:Temp:  [97.3 ?F (36.3 ?C)-98.2 ?F (36.8 ?C)] 97.9 ?F (36.6 ?C)Pulse:  [55-64] 59Resp:  [16-19] 19BP: (105-138)/(62-74) 105/62SpO2:  [92 %-97 %] 96 %General: NAD, AOHEENT: NCATCV: RRRResp: Breathing comfortably on RAAbd: Soft, ND/NT, incisions c/d/iGU: Voiding spontaneouslyExt: WWPI/O last 3 completed shifts:In: 790 [P.O.:790]Out: 1100 [Urine:1100]A:2 Days Post-Op after eras laparoscopic sleeve gastrectomy, egd (N/A ) Doing well on floor. Pain controlled, tolerating diet. Possible discharge today. Strict I/O, encourage OOB, ambulation, IS. Please call with questions or concerns.Signed: Andi Hence, MD, PhD, PGY-2Mobile HB: [phone]/29/229:03 AMContact:MIS Pager

## 2020-05-13 NOTE — Plan of Care
Plan of Care Overview/ Patient Status    Patient is A&O x4. VSS, RA. Patient resting comfortably in chair. Oxycodone given prn for abdominal pain w/ good effect. Patient c/o nausea patient spitting  given zofran instructed to slow down drinking, good effect. Patient showered today, education for proper showering technique with incisions done. OOB independently. LS clear bilaterally throughout, IS encouraged 10x/ hr. No c/o SOB or CP. CMS+ in all extremities. BS hypoactive in all quadrants, Abdomen soft and tender. Abdominal lap sites CDI w/ steri strips. Tolerating regular diet. LBM 1/26. Voiding spontaneously in the bathroom. Safety precautions maintained. Call bell in reach. Bed in a lowest and locked position. Anti-skid socks in place while ambulating.  Irineo Axon RN

## 2020-05-14 DIAGNOSIS — K219 Gastro-esophageal reflux disease without esophagitis: Secondary | ICD-10-CM

## 2020-05-14 DIAGNOSIS — Z7982 Long term (current) use of aspirin: Secondary | ICD-10-CM

## 2020-05-14 DIAGNOSIS — Z87891 Personal history of nicotine dependence: Secondary | ICD-10-CM

## 2020-05-14 DIAGNOSIS — I10 Essential (primary) hypertension: Secondary | ICD-10-CM

## 2020-05-14 DIAGNOSIS — I4891 Unspecified atrial fibrillation: Secondary | ICD-10-CM

## 2020-05-14 DIAGNOSIS — G629 Polyneuropathy, unspecified: Secondary | ICD-10-CM

## 2020-05-14 DIAGNOSIS — G4733 Obstructive sleep apnea (adult) (pediatric): Secondary | ICD-10-CM

## 2020-05-14 DIAGNOSIS — G5710 Meralgia paresthetica, unspecified lower limb: Secondary | ICD-10-CM

## 2020-05-14 DIAGNOSIS — D509 Iron deficiency anemia, unspecified: Secondary | ICD-10-CM

## 2020-05-14 DIAGNOSIS — M170 Bilateral primary osteoarthritis of knee: Secondary | ICD-10-CM

## 2020-05-14 DIAGNOSIS — E119 Type 2 diabetes mellitus without complications: Secondary | ICD-10-CM

## 2020-05-14 DIAGNOSIS — K449 Diaphragmatic hernia without obstruction or gangrene: Secondary | ICD-10-CM

## 2020-05-14 DIAGNOSIS — Z6841 Body Mass Index (BMI) 40.0 and over, adult: Secondary | ICD-10-CM

## 2020-05-14 DIAGNOSIS — E7800 Pure hypercholesterolemia, unspecified: Secondary | ICD-10-CM

## 2020-05-14 DIAGNOSIS — G8929 Other chronic pain: Secondary | ICD-10-CM

## 2020-05-14 DIAGNOSIS — K760 Fatty (change of) liver, not elsewhere classified: Secondary | ICD-10-CM

## 2020-05-14 DIAGNOSIS — Z888 Allergy status to other drugs, medicaments and biological substances status: Secondary | ICD-10-CM

## 2020-05-14 NOTE — Plan of Care
Plan of Care Overview / Patient Status    Pt A&O x4, pleasant and cooperative, expresses her hope for d/c tonight. VSS. No c/o pain. Ls clear on RA. No c/o pain. Tolerating step 3 diet well w/ adequate fluid intake, teach back performed. C/o nausea x1, given zofran w/ good effect. Abd soft w/ some tenderness. Lap sites CDI. +BS. No B/M educated on bowel reg.VS in bathroomSafety maintained. Will continue to monitor.Bethany Wiggins was discharged via Private Car accompanied by Family Member (sister).Verbalized understanding of discharge instructionsand recommended follow up care as per the after visit summary.  Written discharge instructions provided. Denies any further questions. Confusion r/t vitamins following discharge clarified w/ patient. IV removed. Belongings w/ patient. Vital signs    Vitals:  05/13/20 2016 05/13/20 2345 05/14/20 0446 05/14/20 0820 BP: 108/71 118/78 108/71 115/79 Pulse: 61 (!) 56 65 87 Resp: 18 18 16 19  Temp: 98.6 ?F (37 ?C) 98.4 ?F (36.9 ?C) 98.7 ?F (37.1 ?C) 99.5 ?F (37.5 ?C) TempSrc: Oral Axillary Oral Oral SpO2: (!) 93% 97% (!) 93% 94% Weight:     Height:

## 2020-05-14 NOTE — Progress Notes
24h Events:NAEOPain well controlledTolerating step 3 diet with 670 cc PO intakeOOB, ambulatingS:She is having no abdominal pain.She is having no nausea or vomiting. She is tolerating a diet.She is passing gas and is not having bowel movements.O:Temp:  [97.8 ?F (36.6 ?C)-98.7 ?F (37.1 ?C)] 98.7 ?F (37.1 ?C)Pulse:  [56-80] 65Resp:  [16-19] 16BP: (105-118)/(62-78) 108/71SpO2:  [93 %-97 %] 93 %General: NAD, AOHEENT: NCATCV: RRRResp: Breathing comfortably on RAAbd: Soft, ND/NT, incisions c/d/iGU: Voiding spontaneouslyExt: WWPI/O last 3 completed shifts:In: 670 [P.O.:670]Out: 700 [Urine:700]A:3 Days Post-Op after eras laparoscopic sleeve gastrectomy, egd (N/A ) Doing well on floor. Pain controlled, tolerating diet. Possible discharge today. Strict I/O, encourage OOB, ambulation, IS. Please call with questions or concerns.Signed: Andi Hence, MD, PhD, PGY-2Mobile HB: [phone]) [phone]/30/227:30 AMContact:MIS Pager

## 2020-05-14 NOTE — Plan of Care
Plan of Care Overview/ Patient Status    Problem: Adult Inpatient Plan of CareGoal: Readiness for Transition of CareOutcome: Outcome(s) achieved The patient has been cleared for discharge home today. The discharge plan has been reviewed with the patient and team. No CM needs identified. The patient is in agreement.Case Management Plan    Most Recent Value Discharge Planning Patient/Patient Representative goals/treatment preferences for discharge are:  to take the weight off Patient/Patient Representative was presented with a list of facilities, agencies and/or dme providers and Referral(s) placed for: None Mode of Transportation  Private car  (add comment for special considerations) Patient accompanied by family CM D/C Readiness PASRR completed and approved N/A Authorization number obtained, if required N/A Is there a 3 day INPATIENT Qualifying stay for Medicare Patients? N/A Medicare IM- signed, dated, timed and scanned, if required N/A DME Authorized/Delivered N/A No needs identified/ follow up with PCP/MD N/A Post acute care services secured W10 complete N/A Pri Completed and Accepted  N/A Is the destination address correct on the W10 N/A Finalized Plan Expected Discharge Date 05/14/20 Discharge Disposition Home or Self Care  Dow Adolph RN CCM Care ManagementMHB [phone]

## 2020-05-14 NOTE — Plan of Care
Plan of Care Overview/ Patient Status    Pt A&Ox4. VSS. RA. Step 3 diet tolerated. Denies sob, chest pain, n/v/d. No respiratory distress noted. CPAP @ bedtime. Refusing scheduled Tylenol. PRN Oxy w + effect. Lap sites w steri strips cdi. IS. OOB independently. Voiding spontaneously in the toilet. Meds per MAR. POC reviewed w pt who verbalized understanding. Safety maintained. Call bell within reach. Will continue to follow POC. Ayerim Berquist da Bernerd Pho, RN

## 2020-05-15 ENCOUNTER — Encounter: Admit: 2020-05-15 | Payer: PRIVATE HEALTH INSURANCE

## 2020-05-15 NOTE — Discharge Summary
[facility] Hospital-SrcMed/Surg Discharge SummaryPatient Data:  Patient Name: Bethany Wiggins Admit date: 05/11/2020 Age: 60 y.o. Discharge date: 05/14/2020 DOB: November 28, 1960	 Discharge Attending Physician: Bethany Ou, MD  MRN: [id number]	 Discharged Condition: good PCP: Bethany Kava, DO Disposition: Home  Principal Diagnosis: morbid obesitySecondary Diagnoses: afib, DM, HTN, OSA on CPAPIssues to be Addressed Post Discharge: Issues to be Addressed Post Discharge:1.	Surgical follow-up with Bethany Evener APRN. 2.	Nutrition follow-up with Bethany Wiggins.	Wound care. 4.	Pain controlRelevant Medications on Discharge:New Medications: see below Pending Labs and Tests: Pending Lab Results   Order Current Status  Pathology [facility]) Collected (05/11/20 0911)  Follow-up Information:Bethany Wiggins, Bethany Wiggins [id number] [id number] 2/10/2022at 11:30am for surgical follow-up , this visit will be virtual through Bethany Wiggins, RDOn 2/8/2022at 1:30pm for nutritional follow-up, this visit will be virtual through [place] Future Appointments Date Time Provider Department Center 05/25/2020 11:30 AM Bethany Evener, APRN GI SRG [facility] CAD 05/25/2020  1:30 PM Bethany Wiggins, RD BARIAT [facility] [place] 05/30/2020  3:30 PM Bethany Wiggins, DPM [facility] PODIAT [facility] Course: Hospital Course: Bethany Wiggins is a 60 y.o. female with a hx of afib, DM, HTN, OSA on CPAP was admitted to the hospital on 05/11/2020 after undergoing laparoscopic sleeve gastrectomy with Dr. Gwenevere Abbot. The procedure was tolerated well. Postoperatively, the patient was extubated and brought to the PACU in stable condition. Later, she was transferred to the surgical floor and evaluated by a member of the surgical team. At that time, Bethany Wiggins's pain was well controlled and she was hemodynamically stable. The patient was able to void. HSQ and SCD were initiated for DVT prophylaxis. She was started on a step 1 diet with good toleration. On POD1, the patient was OOB to chair and ambulatory throughout treatment room. Her pain was controlled and she denied any nausea.On POD2, patient continued to ambulate OOB with good pain and nausea control.On POD3/day of discharge Bethany Wiggins was stable for discharge home. Prescription medications and follow-up appointments were provided to the patient and discussed during discharge teaching and are listed below. Pertinent Procedures or Surgeries: Surgical and Procedural Summary this Admission   Past and Present Procedures (05/11/2020 to Today)   Date Procedures Providers Location  05/11/2020 eras laparoscopic sleeve gastrectomy, egd Bethany Wiggins [facility] MAIN OR    Pertinent lab findings and test results: Objective: No results for input(s): WBC, HGB, HCT, PLT in the last 168 hours. No results for input(s): NEUTROPHILS, LABLYMP, LABEOS, BANDSP in the last 168 hours. Recent Labs Lab 01/27/221030 GLU 162*  No results for input(s): CALCIUM, MG, PHOS in the last 168 hours. No results for input(s): ALT, AST, ALKPHOS, BILITOT, BILIDIR in the last 168 hours. No results for input(s): PTT, LABPROT, INR in the last 168 hours. Culture Information:No results for input(s): LABBLOO, LABURIN, LOWERRESPIRA in the last 168 hours.Diet:  Gastric bypass diet (Third step): 2 oz Ensure Plus full strength three times a day. 1 oz water each hour between Ensure PlusMobility: Baseline, OOB independent  Physical Exam Discharge vitals: Temp:  [97 ?F (36.1 ?C)-97.3 ?F (36.3 ?C)] 97 ?F (36.1 ?C)Pulse:  [70-74] 72Resp:  [11-22] 22BP: (135-153)/(81-87) 135/85SpO2:  [96 %-98 %] 98 %Device (Oxygen Therapy): nasal cannulaO2 Flow (L/min):  [2] 2 Cognitive Status at Discharge: BaselineDischarge Physical Exam:Physical ExamGeneral: NADHEENT: NCATCV: RRResp: Breathing comfortably on RAAbd: Soft, ND/NT, incisions c/d/iGU: Voiding spontaneouslyExt: WWPAllergies Allergies Allergen Reactions ? Lisinopril Angioedema  PMH PSH Past Medical History: Diagnosis Date ? A-fib (HC Code) (HC CODE)  ?  Anemia  ? Arthritis  ? CPAP (continuous positive airway pressure) dependence   has c-pap; doesnt use it ;  too much pressure ? Diabetes mellitus (HC Code) (HC CODE)  ? GERD (gastroesophageal reflux disease)  ? High cholesterol  ? Hypertension  ? Iron deficiency  ? Neuropathy (HC Code)  ? Obstructive sleep apnea   Past Surgical History: Procedure Laterality Date ? DENTAL SURGERY   ? SPLIT SLEEP STUDY  03/16/2018  Social History Family History Social History Tobacco Use ? Smoking status: Former Smoker   Packs/day: 0.10   Years: 40.00   Pack years: 4.00   Types: Cigarettes   Quit date: 10/16/2018   Years since quitting: 1.5 ? Smokeless tobacco: Never Used ? Tobacco comment: 1/2-1ppd since age 55; quit 10/2018 w/ some relapses Substance Use Topics ? 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 Hypertension Father  ? Diabetes Father  ? Coronary Artery Disease Father       stents ? Cervical cancer Sister  ? Diabetes Paternal Aunt  ? Hypertension Paternal Aunt  ? Uterine cancer Paternal Aunt       ?uterine cancner ? Diabetes Paternal Uncle  ? Hypertension Paternal Uncle  ? Breast cancer Neg Hx  ? Ovarian cancer Neg Hx  ? Colon cancer Neg Hx   Discharge Medications: Discharge: Current Discharge Medication List  START taking these medications  Details acetaminophen (TYLENOL) 325 mg tablet Take 3 tablets (975 mg total) by mouth every 6 (six) hours for 10 days.Qty: 30 tablet, Refills: 11Start date: 05/12/2020, End date: 05/22/2020  omeprazole (PRILOSEC) 20 mg capsule Take 1 capsule (20 mg total) by mouth daily.Qty: 90 capsule, Refills: 1Start date: 05/12/2020  polyethylene glycol (MIRALAX) 17 gram packet Take 1 packet (17 g total) by mouth daily as needed for constipation. If you do not have a bowel movement on post-op day 3, please begin taking 1 cap-full daily as needed Mix in 8 ounces of water, juice, soda, coffee or tea prior to taking.Qty: 14 each, Refills: 2Start date: 05/12/2020  senna (SENOKOT) 8.6 mg tablet Take 1 tablet (8.6 mg total) by mouth 2 (two) times daily.Qty: 30 tablet, Refills: 11Start date: 05/12/2020   CONTINUE these medications which have NOT CHANGED  Details allopurinoL (ZYLOPRIM) 100 mg tablet Take 1 tablet (100 mg total) by mouth daily.Qty: 90 tablet, Refills: 1  aspirin 81 mg EC delayed release tablet Take 1 tablet (81 mg total) by mouth daily.Qty: 90 tablet, Refills: 1  cyclobenzaprine (FLEXERIL) 5 mg tablet Take 1 tablet (5 mg total) by mouth 2 (two) times daily as needed for muscle spasms.Qty: 30 tablet, Refills: 0  Associated Diagnoses: Cervicalgia  fluticasone propionate (FLONASE) 50 mcg/actuation nasal spray Use 2 sprays in each nostril daily.Qty: 16 g, Refills: 3Start date: 05/11/2020  Associated Diagnoses: Rhinitis, unspecified type  furosemide (LASIX) 20 mg tablet Take 1 tablet (20 mg total) by mouth daily. For leg swellingQty: 90 tablet, Refills: 1  Associated Diagnoses: Bilateral leg edema  gabapentin (NEURONTIN) 600 mg tablet Take 1 tablet (600 mg total) by mouth 3 (three) times daily with meals.Qty: 90 tablet, Refills: 2  Associated Diagnoses: Meralgia paresthetica, unspecified laterality  levocetirizine (XYZAL) 5 mg tablet Take 1 tablet (5 mg total) by mouth every evening before dinner.Qty: 90 tablet, Refills: 1  Associated Diagnoses: Rhinitis, unspecified type  LIDODERM 5 % Place 2 to 3 patches onto the skin daily. Remove & Discard  patch within 12 hours or as directed by provider.Qty: 30 patch, Refills: 0  Associated Diagnoses: Chronic pain of both lower extremities  rosuvastatin (CRESTOR) 20 mg tablet Take 1 tablet (20 mg total) by mouth daily.Qty: 90 tablet, Refills: 1  Associated Diagnoses: Type 2 diabetes mellitus with hyperosmolarity without coma, without long-term current use of insulin (HC Code) (HC CODE)  triamterene-hydroCHLOROthiazide (DYAZIDE) 37.5-25 mg per capsule Take 1 capsule by mouth every morning.Qty: 90 capsule, Refills: 1  Miscellaneous Medical Supply Please send 4 legged cane, for stability and chronic knee pain, Dx M25.561Qty: 1 each, Refills: 0   STOP taking these medications   blood sugar diagnostic (FREESTYLE LITE) test strips    ferrous sulfate (FEOSOL) 325 mg (65 mg iron) tablet    lancets (RELIAMED LANCET) 30 gauge Misc lancets    metFORMIN (GLUCOPHAGE) 500 mg Immediate Release tablet    clindamycin (CLEOCIN T) 1 % external solution    clotrimazole-betamethasone (LOTRISONE) 1-0.05 % cream    ketamine/doxepin/nifedipine/pentoxifylline: 10-5-8-8% topical (compound)    multivitamin (MULTIVITAMIN) tablet    There was at total of approximately 20 minutes spent on the discharge of this patientElectronically Signed:Lina Starovoitova, PA // Filbert Berthold, DDS1/27/2022

## 2020-05-15 NOTE — Brief Op Note
[facility] HealthPatient Name: Bethany Wiggins        [id number] Patient DOB: 1960-12-03     Surgery Date: 1/27/2022Surgeon(s) and Role:   Dorann Ou, MD - PrimaryAssistant(s):Resident: Garen Grams Antonietta Jewel, MD; Andi Hence, MDStaff:  Circulator: Doran Clay, RNRelief Circulator: Westley Hummer, RNRelief Scrub: Samul Dada, AngelScrub Person: Katrinka Blazing, TonyaSurgical Tech Student: Ardyth Gal, NikkiPre-Op Diagnosis: Morbid obesity (HC Code) (HC CODE) [E66.01] Procedure(s) and Anesthesia Type:   * eras laparoscopic sleeve gastrectomy, egd - GENERALOperative Findings (enter relevant operative findings; do not refer to an operative report that is not yet transcribed): fatty liverSigns of infection present at the time of surgery at the operative site: None Blood and Blood Products: none                 Drains:  noneImplants: * No implants in log * Specimens: ID Type Source Tests Collected by Time 1 : gastric sleeve resection Tissue Stomach PATHOLOGY [facility]) Dorann Ou, MD 05/11/2020  9:11 AM  Clinical Staging: naEBL: Minimal       Post Operative Diagnosis: * No post-op diagnosis entered * Dorann Ou, MD1/31/20223:25 PM

## 2020-05-15 NOTE — Other
[facility] HealthPatient [facility]        [id number] Patient DOB: 1961/01/16     Surgery Date: 1/27/2022Surgeon(s) and Role:   Dorann Ou, MD - PrimaryAssistant(s):Resident: Garen Grams Antonietta Jewel, MD; Andi Hence, MDStaff:  Circulator: Doran Clay, RNRelief Circulator: Westley Hummer, RNRelief Scrub: Samul Dada, AngelScrub Person: Katrinka Blazing, TonyaSurgical Tech Student: Ardyth Gal, NikkiPre-Op Diagnosis: Morbid obesity (HC Code) (HC CODE) [E66.01] Procedure(s) and Anesthesia Type:   * eras laparoscopic sleeve gastrectomy, egd - GENERALOperative Findings (enter relevant operative findings; do not refer to an operative report that is not yet transcribed): fatty liverSigns of infection present at the time of surgery at the operative site: None Blood and Blood Products: none                 Drains:  noneImplants: * No implants in log * Specimens: ID Type Source Tests Collected by Time 1 : gastric sleeve resection Tissue Stomach PATHOLOGY [facility]) Dorann Ou, MD 05/11/2020  9:11 AM  Clinical Staging: naEBL: Minimal       Post Operative Diagnosis: sameIndications: Patient meets 1991 NIH Consensus Conference for bariatric surgery. Patient was advised of risks and benefits of procedure and consented to procedure.Procedure: Patient was brought to the OR and placed in supine position. Patient received antibiotics, heparin, GETA and TAPP block. Pneumoperitoneum was obtained with Veress needle and trocars were placed under direct visualization. Greater curve of stomach vasculature was taken down with Thunderbeat. Gastroscope was placed into stomach and a 34-36 [place] gastric sleeve was made with multiple firings of endo gia purple buttressed loads. Leak test was negative. At conclusion of the case, all needle and lap counts were correct x 2, adequate hemostasis was noted and I was present and scrubbed throughout entire procedure. Dorann Ou, MD, MPH

## 2020-05-17 ENCOUNTER — Telehealth: Admit: 2020-05-17 | Payer: PRIVATE HEALTH INSURANCE | Attending: Surgery

## 2020-05-17 NOTE — Telephone Encounter
Surgeon Role Service Start Time End Time Dorann Ou, MD Primary Laurette Schimke ?8:38 AM ?9:59 AM Procedure: eras laparoscopic sleeve gastrectomy, egd1/27/22   Pt calls states she has pain left clavicle pain started yesterday , no pain now but intermittent  has pain 6/10 at times worse with deep breath, sharp at timesNo n/vNo abd pain No c/p , no sob, no dizzy , no diaphoresis No leg painPt needs help with advise for how to drink water volume Drinking shakes as directed

## 2020-05-17 NOTE — Telephone Encounter
Spoke with patient , encouraged very small sips frequently , patient started to walk today , takes Prilosec once daily. Will call if no improvement

## 2020-05-17 NOTE — Telephone Encounter
Laparoscopic sleeve gastrectomy, egd on 05/11/20 with Dr.Morton .Patient has intermittent left clavicle pain , no back pain or SOB , no leg pain . Patient advised to start walking , apply warm compresses and take Gas-ex .Patient has stomach cramps every single time while trying to drink water , in two days only able to get 30 oz . Advised to start drinking crystal light, chicken broth, diet gelatin, sugar free ice pops, decaf coffee or tea, flavored water without calories and follow step 3 protein full liquids - patient has instructions.

## 2020-05-25 ENCOUNTER — Encounter: Admit: 2020-05-25 | Payer: MEDICAID | Attending: Family

## 2020-05-25 ENCOUNTER — Encounter: Admit: 2020-05-25 | Payer: PRIVATE HEALTH INSURANCE | Attending: Family

## 2020-05-25 DIAGNOSIS — M1990 Unspecified osteoarthritis, unspecified site: Secondary | ICD-10-CM

## 2020-05-25 DIAGNOSIS — E611 Iron deficiency: Secondary | ICD-10-CM

## 2020-05-25 DIAGNOSIS — I4891 Unspecified atrial fibrillation: Secondary | ICD-10-CM

## 2020-05-25 DIAGNOSIS — D649 Anemia, unspecified: Secondary | ICD-10-CM

## 2020-05-25 DIAGNOSIS — G629 Polyneuropathy, unspecified: Secondary | ICD-10-CM

## 2020-05-25 DIAGNOSIS — I10 Essential (primary) hypertension: Secondary | ICD-10-CM

## 2020-05-25 DIAGNOSIS — G4733 Obstructive sleep apnea (adult) (pediatric): Secondary | ICD-10-CM

## 2020-05-25 DIAGNOSIS — E7800 Pure hypercholesterolemia, unspecified: Secondary | ICD-10-CM

## 2020-05-25 DIAGNOSIS — K219 Gastro-esophageal reflux disease without esophagitis: Secondary | ICD-10-CM

## 2020-05-25 DIAGNOSIS — E119 Type 2 diabetes mellitus without complications: Secondary | ICD-10-CM

## 2020-05-25 DIAGNOSIS — Z9989 Dependence on other enabling machines and devices: Secondary | ICD-10-CM

## 2020-05-25 DIAGNOSIS — Z09 Encounter for follow-up examination after completed treatment for conditions other than malignant neoplasm: Secondary | ICD-10-CM

## 2020-05-25 MED ORDER — BARIATRIC MULTIVITAMINS 45 MG IRON-800 MCG-120 MCG CAPSULE
45 mg iron- 800 mcg-120 mcg | ORAL | Status: AC
Start: 2020-05-25 — End: ?

## 2020-05-25 MED ORDER — GAS-X ORAL
ORAL | Status: AC
Start: 2020-05-25 — End: 2021-01-08

## 2020-05-25 MED ORDER — CALCIUM SOFT CHEW ORAL
ORAL | Status: AC
Start: 2020-05-25 — End: ?

## 2020-05-25 NOTE — Progress Notes
Bethany Wiggins returns to the office today for her 2 week post operative visit following a laparoscopic sleeve gastrectomy.  Her  weight today is   lbs. Preoperatively, she was Pre Op Weight: 143.8 kg.  She denies chest pain, shortness of breath, abdominal pain, calf pain and calf swelling. Toleration of diet: Liquid diet of 6 / meals / day x 2 weeks with 3 protein supplements per day tolerated well Bowel movements are: intermittently constipated Pain: The patient is not having any pain..   Pathology on 05/11/20: pathology  Patient: Bethany Perfect D. ? ? ? ? ? ? ? ? ?MR #: [id number] ([id number]) Accession #: [id number] ? ? ? ?Submitted by: Dorann Ou FINAL DIAGNOSIS GASTRIC SLEEVE; RESECTION: ? ? ?- GASTRIC WALL WITH SURFACE OXYNTIC MUCOSA, NO SIGNIFICANT ABNORMALITY ? ? Pathologist: He Regino Schultze, M.D. ? ?05/12/2020 13:05 ? ? ?* Report Electronically Signed Out *  Objective:   General:    Alert, oriented, no acute distress Abdomen:  Incision:   healing well, no drainage, no erythema, no hernia, no seroma, no swelling, no dehiscence, incision well approximated        Assessment/Plan: Bethany Wiggins under went a laparoscopic sleeve gastrectomy on 05/11/20 and is doing well postoperatively. She is adhering to the diet and is exercising daily. She is unsure about weight loss due to not having a scale(she ordered one and should be arriving soon). She will follow up in 6 weeks for her 2 month postop appointment. She has no exercise restrictions at this time. We have advised she start the chewable bariatric multivitamin with 45mg  iron and calcium. Continue PPI daily. VIDEO TELEHEALTH VISIT: This clinician is part of the telehealth program and is conducting this visit in a currently approved location. For this visit the clinician and patient were present via interactive audio & video telecommunications system that permits real-time communications, via the [employer].Patient's use of the telehealth platform followed consent and acknowledges agreement to permit telehealth for this visit. State patient is located in: [place] clinician is appropriately licensed in the above state to provide care for this visit. Other individuals present during the telehealth encounter and their role/relation: noneIf billing based on time, please complete (Not required if billing based on MDM):                           Total time spent in medical video consultation: 20 minutes; Total time spent by the provider on the day of service, which includes time spent on chart review, medical video consultation, education, coordination of care/services and counseling Because this visit was completed over video, a hands-on physical exam was not performed.  Patient/parent or guardian understands and knows to call back if condition changes.

## 2020-05-26 ENCOUNTER — Encounter: Admit: 2020-05-26 | Payer: PRIVATE HEALTH INSURANCE

## 2020-05-27 ENCOUNTER — Inpatient Hospital Stay: Admit: 2020-05-27 | Discharge: 2020-05-27 | Payer: MEDICAID

## 2020-05-27 ENCOUNTER — Emergency Department: Admit: 2020-05-27 | Payer: PRIVATE HEALTH INSURANCE

## 2020-05-27 DIAGNOSIS — M79602 Pain in left arm: Principal | ICD-10-CM

## 2020-05-27 DIAGNOSIS — R2232 Localized swelling, mass and lump, left upper limb: Secondary | ICD-10-CM

## 2020-05-27 DIAGNOSIS — M79632 Pain in left forearm: Secondary | ICD-10-CM

## 2020-05-27 MED ORDER — LIDOCAINE 1 %-EPINEPHRINE 1:100,000 INJECTION SOLUTION
1 %-:00,000 | Freq: Once | INTRADERMAL | Status: CP
Start: 2020-05-27 — End: ?
  Administered 2020-05-28: 02:00:00 1 mL via INTRADERMAL

## 2020-05-27 MED ORDER — SULFAMETHOXAZOLE 800 MG-TRIMETHOPRIM 160 MG TABLET
800-160 mg | Freq: Once | ORAL | Status: CP
Start: 2020-05-27 — End: ?
  Administered 2020-05-28: 02:00:00 800-160 mg via ORAL

## 2020-05-27 MED ORDER — SULFAMETHOXAZOLE 800 MG-TRIMETHOPRIM 160 MG TABLET
800-160 mg | ORAL_TABLET | Freq: Two times a day (BID) | ORAL | 1 refills | Status: AC
Start: 2020-05-27 — End: ?

## 2020-05-28 DIAGNOSIS — Z5941 Food insecurity: Secondary | ICD-10-CM

## 2020-05-28 DIAGNOSIS — K219 Gastro-esophageal reflux disease without esophagitis: Secondary | ICD-10-CM

## 2020-05-28 DIAGNOSIS — G4733 Obstructive sleep apnea (adult) (pediatric): Secondary | ICD-10-CM

## 2020-05-28 DIAGNOSIS — E7800 Pure hypercholesterolemia, unspecified: Secondary | ICD-10-CM

## 2020-05-28 DIAGNOSIS — Z9989 Dependence on other enabling machines and devices: Secondary | ICD-10-CM

## 2020-05-28 DIAGNOSIS — I4891 Unspecified atrial fibrillation: Secondary | ICD-10-CM

## 2020-05-28 DIAGNOSIS — Z87891 Personal history of nicotine dependence: Secondary | ICD-10-CM

## 2020-05-28 DIAGNOSIS — Z7982 Long term (current) use of aspirin: Secondary | ICD-10-CM

## 2020-05-28 DIAGNOSIS — Z888 Allergy status to other drugs, medicaments and biological substances status: Secondary | ICD-10-CM

## 2020-05-28 DIAGNOSIS — Z79899 Other long term (current) drug therapy: Secondary | ICD-10-CM

## 2020-05-28 DIAGNOSIS — E119 Type 2 diabetes mellitus without complications: Secondary | ICD-10-CM

## 2020-05-28 DIAGNOSIS — G629 Polyneuropathy, unspecified: Secondary | ICD-10-CM

## 2020-05-28 DIAGNOSIS — Z9884 Bariatric surgery status: Secondary | ICD-10-CM

## 2020-05-28 DIAGNOSIS — I10 Essential (primary) hypertension: Secondary | ICD-10-CM

## 2020-05-28 DIAGNOSIS — L02414 Cutaneous abscess of left upper limb: Secondary | ICD-10-CM

## 2020-05-28 NOTE — ED Triage Note
Provider in Triage Note84 y.o. year old female  presents with 2days of arm pain and swelling, concern for abscess.  S/p gastric sleeve surgery in January.Physical Exam: tenderness and swelling to left armOrders placed in triage: noDisposition: Express Care for further evaluation.[name]2/03/2021 8:04 PM

## 2020-05-28 NOTE — ED Notes
9:30 PM I&D being done at bedside

## 2020-05-28 NOTE — Discharge Instructions
Take Bactrim twice daily, once every 12 hours, for the next 7 daysKeep wound clean.  Wash with soap and water several times daily.Follow-up with her primary care provider within the next 2 to 3 days for wound checkReturn to the ED for any new or worsening of symptoms including fever, increased redness or for any other concerns.

## 2020-05-28 NOTE — ED Provider Notes
HistoryChief Complaint Patient presents with ? Skin Problem   To ED for eval of left arm localized pain and swelling just below elbow are/poterior forearm, noticed sx  2 days ago.  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  She states that she noticed redness and swelling to her left forearm 2 days ago which has progressively worsened.  Upon evaluation the ED she notes continued pain and redness to the area.  Nothing makes her symptoms better and palpation makes her pain worse.  She has not taken any medications.  She denies fevers, chills, headache, dizziness, chest pain shortness breath abdominal pain, injury, decreased sensation, range of motion and strength.The history is provided by the patient. No language interpreter was used. AbscessLocation:  Shoulder/armShoulder/arm abscess location:  L forearmSize:  2cmAbscess quality: fluctuance, induration, painful, redness and warmth  Red streaking: no  Duration:  2 daysProgression:  UnchangedPain details:   Severity:  Moderate  Duration:  2 days  Timing:  Constant  Progression:  WorseningChronicity:  NewContext: not immunosuppression and not injected drug use  Relieved by:  NothingWorsened by:  Draining/squeezingIneffective treatments:  None triedAssociated symptoms: no fatigue, no fever, no headaches, no nausea and no vomiting   Past Medical History: Diagnosis Date ? A-fib (HC Code) (HC CODE)  ? Anemia  ? Arthritis  ? CPAP (continuous positive airway pressure) dependence   has c-pap; doesnt use it ;  too much pressure ? Diabetes mellitus (HC Code) (HC CODE)  ? GERD (gastroesophageal reflux disease)  ? High cholesterol  ? Hypertension  ? Iron deficiency  ? Neuropathy (HC Code)  ? Obstructive sleep apnea  Past Surgical History: Procedure Laterality Date ? DENTAL SURGERY   ? SLEEVE GASTROPLASTY  05/11/2020 ? SPLIT SLEEP STUDY  03/16/2018 Family History Problem Relation Age of Onset ? Hypertension Father  ? Diabetes Father  ? Coronary Artery Disease Father       stents ? Cervical cancer Sister  ? Diabetes Paternal Aunt  ? Hypertension Paternal Aunt  ? Uterine cancer Paternal Aunt       ?uterine cancner ? Diabetes Paternal Uncle  ? Hypertension Paternal Uncle  ? Breast cancer Neg Hx  ? Ovarian cancer Neg Hx  ? Colon cancer Neg Hx  Social History Socioeconomic History ? Marital status: Single   Spouse name: Not on file ? Number of children: Not on file ? Years of education: Not on file ? Highest education level: Not on file Tobacco Use ? Smoking status: Former Smoker   Packs/day: 0.10   Years: 40.00   Pack years: 4.00   Types: Cigarettes   Quit date: 10/16/2018   Years since quitting: 1.6 ? Smokeless tobacco: Never Used ? Tobacco comment: 1/2-1ppd since age 76; quit 10/2018 w/ some relapses Vaping Use ? Vaping Use: Never used Substance and Sexual Activity ? 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 Drug use: Not Currently   Types: Cocaine   Comment: long time hx of cocaine abuse'/ not in past year ? 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  -Patient reports live in friend's house, would like her own place, therefore Clinical research associate referred her to the [employer] and [employer] [address] office.  -No income, lost her job and pending on unemployment benefit. Food insecurity.  -Transportation, she have her own vehicle. ED Other Social History ? E-cigarette status Never User  ? E-Cigarette Use Never User  E-cigarette/Vaping Substances E-cigarette/Vaping Devices Review of Systems Constitutional: Negative for fatigue and fever. Respiratory: Negative for chest tightness and shortness of breath.  Cardiovascular: Negative for chest pain. Gastrointestinal: Negative for nausea and vomiting. Genitourinary: Negative for difficulty urinating. Musculoskeletal: Positive for myalgias. Skin: Positive for color change. Neurological: Negative for dizziness and headaches. Psychiatric/Behavioral: Negative for agitation and behavioral problems.  Physical ExamED Triage Vitals [05/27/20 2010]BP: 136/82Pulse: 72Pulse from  O2 sat: n/aResp: 18Temp: 97.9 ?F (36.6 ?C)Temp src: OralSpO2: 96 % BP 136/82  - Pulse 72  - Temp 97.9 ?F (36.6 ?C) (Oral)  - Resp 18  - Wt (!) 140.6 kg  - SpO2 96%  - BMI 54.93 kg/m? Physical ExamVitals and nursing note reviewed. Constitutional:     Appearance: She is well-developed. HENT:    Head: Normocephalic and atraumatic. Eyes:    Conjunctiva/sclera: Conjunctivae normal. Cardiovascular:    Rate and Rhythm: Normal rate and regular rhythm.    Heart sounds: Normal heart sounds. Pulmonary:    Effort: Pulmonary effort is normal.    Breath sounds: Normal breath sounds. Musculoskeletal:       General: Normal range of motion.    Cervical back: Normal range of motion. Skin:   General: Skin is warm and dry.    Findings: Erythema present.    Comments: Erythema, warmth to left posterior forearm with 2cm area of induration/fluctuance. No red streaking  Intact distal pulses Neurological:    Mental Status: She is alert and oriented to person, place, and time. Psychiatric:       Mood and Affect: Mood normal.       Behavior: Behavior normal.  ProceduresI & DPerformed by: Skiler Tye, Victorino Dike, PAAuthorized by: Narda Rutherford, PA Location:   Type:  Abscess  Size:  2cmx2cm  Location:  Upper extremity  Upper extremity location:  Arm  Arm location:  L lower armPre-procedure details:   Skin preparation:  BetadineAnesthesia (see MAR for exact dosages):   Anesthesia method:  Local infiltration  Local anesthetic:  Lidocaine 1% WITH epiProcedure type:   Complexity:  SimpleProcedure details:   Needle aspiration: no    Incision types:  Single straight  Scalpel blade:  11  Wound management:  Probed and deloculated, extensive cleaning and irrigated with saline  Drainage:  Purulent  Drainage amount:  Moderate  Wound treatment:  Wound left open  Packing materials:  NonePost-procedure details:   Patient tolerance of procedure:  Tolerated well, no immediate complicationsResident/APP MDM:=====================================Jericho Cieslik PA-CCC:  This is a  60 year old female with past medical history of diabetes (no meds), GERD, HTN, OSA, 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 Abscess- to left forearm.  Surrounding erythema.  Ultrasound with 2 centimeter abscess with surrounding cellulitis.  Glucose 144. I and D done with moderate purulence drainage.  Will start Bactrim.  To follow up with primary care provider for wound check.  Return precautions discussed.BP 136/82  - Pulse 72  -  Temp 97.9 ?F (36.6 ?C) (Oral)  - Resp 18  - Wt (!) 140.6 kg  - SpO2 96%  - BMI 54.93 kg/m?  Dr. Jaci Standard was available for consultDispo: discharge ?Strict return precautions discussed with patient. ?Well appearing on discharge, ambulatory with a steady gait. ?Results and treatment plan discussed with patient, all questions answered. ?To follow up with pcp. VSS.=====================================ED CourseClinical Impressions as of May 27 2199 Abscess  ED DispositionDischarge Junaid Wurzer, Victorino Dike, PA02/12/22 2201

## 2020-07-19 ENCOUNTER — Telehealth: Admit: 2020-07-19 | Payer: PRIVATE HEALTH INSURANCE | Attending: Physician Assistant

## 2020-07-19 NOTE — Telephone Encounter
Called patient to reschedule 07/31/20 appointment with stephanie dupree,, to Bethany Wiggins.  Per AL - 2 month postop - first available APP

## 2020-07-24 ENCOUNTER — Encounter: Admit: 2020-07-24 | Payer: PRIVATE HEALTH INSURANCE | Attending: Family

## 2020-07-24 ENCOUNTER — Ambulatory Visit: Admit: 2020-07-24 | Payer: MEDICAID | Attending: Family

## 2020-07-24 ENCOUNTER — Encounter: Admit: 2020-07-24 | Payer: PRIVATE HEALTH INSURANCE | Attending: Pulmonary Disease

## 2020-07-24 DIAGNOSIS — G4733 Obstructive sleep apnea (adult) (pediatric): Secondary | ICD-10-CM

## 2020-07-24 DIAGNOSIS — G629 Polyneuropathy, unspecified: Secondary | ICD-10-CM

## 2020-07-24 DIAGNOSIS — M1990 Unspecified osteoarthritis, unspecified site: Secondary | ICD-10-CM

## 2020-07-24 DIAGNOSIS — Z87898 Personal history of other specified conditions: Secondary | ICD-10-CM

## 2020-07-24 DIAGNOSIS — E119 Type 2 diabetes mellitus without complications: Secondary | ICD-10-CM

## 2020-07-24 DIAGNOSIS — Z9989 Dependence on other enabling machines and devices: Secondary | ICD-10-CM

## 2020-07-24 DIAGNOSIS — D649 Anemia, unspecified: Secondary | ICD-10-CM

## 2020-07-24 DIAGNOSIS — E611 Iron deficiency: Secondary | ICD-10-CM

## 2020-07-24 DIAGNOSIS — I10 Essential (primary) hypertension: Secondary | ICD-10-CM

## 2020-07-24 DIAGNOSIS — E7800 Pure hypercholesterolemia, unspecified: Secondary | ICD-10-CM

## 2020-07-24 DIAGNOSIS — K219 Gastro-esophageal reflux disease without esophagitis: Secondary | ICD-10-CM

## 2020-07-24 DIAGNOSIS — I4891 Unspecified atrial fibrillation: Secondary | ICD-10-CM

## 2020-07-24 NOTE — Progress Notes
Patient was identified as a fall risk. Universal Fall precautions protocol maintained during visit; ?	Room was free of clutter ?	Personal items were within reach ?	Verification that all needs were met prior to leaving the examination room. Additional Universal Fall Interventions included: Encouraged patient to use the assistive devices they use at home.

## 2020-07-24 NOTE — Progress Notes
[facility] [address] [place], [name] [id number], [address], [place], [name] [address]OZHYQ: [phone]    Fax: [phone] Chapman Fitch, MD - Hermine Messick, MBA, PA-C - Steffanie Dunn, MD - Lorenza Evangelist, MD, PhDBrian Pixie Casino, MD - Georga Hacking, MD - Camille Bal, PsyD - Wynona Neat, MD Jolayne Haines, MD - Donovan Kail, MD, MS, Medical DirectorH. Barnetta Hammersmith, MD, MPH, DirectorPatient Name:  Bethany Wiggins of Birth:  1962-01-18Date of Service: 4/11/2022EPIC MRN:  [id number] Provider:  Jyl Heinz, APRN-CI had the pleasure of seeing Bethany Wiggins, a 60 y.o. patient with hx of DM, HTN, in follow-up  for treatment of obstructive sleep apnea with BiPAP therapy at 22/18 CWP with heated humidification and tubing, using nasal pillows , DME provider is j[employer]l. She would like to change nasal mask. She had stopped using PAP due to recall be we talked about it and she is willing to resume due to severity of apnea. She has also lost 80 pounds with weight loss surgery. Doing great. Patient denies aerosufflation or air hunger.  There is continued benefit from PAP therapy.Epworth Sleepiness Score today is Epworth Sleepiness Scale 07/23/2020 1. Sitting and reading 0-Would never doze 2. Watching TV 1-Slight chance of dozing 3. Sitting, inactive in a public place (e.g. a theatre or a meeting) 0-Would never doze 4. As a passenger in a car for an hour without a break 0-Would never doze 5. Lying down to rest in the afternoon when circumstances permit 1-Slight chance of dozing 6. Sitting and talking to someone 0-Would never doze 7. Sitting quietly after a lunch without alcohol 0-Would never doze 8. In a car, while stopped for a few minutes in traffic 0-Would never doze Epworth Total Score 2   A typical sleep apnea patient will have the score varying between 10 and 13, which is severe sleepiness, and values above that are considered extremely severe sleepiness.	Review of Systems - ROS as below and per HPI, otherwise negative in detail.Bethany Wiggins   Weight last visit 163 kg, loss of 80 pounds. Last 3 Weights  07/24/20 1359 Weight (lbs): 296 Lbs.  Review of Systems:Constitutional:  Denies   fevers and chills.Cardiovascular:    Denies    chest pain.Respiratory:    Denies    cough.Neurologic:    Denies    headaches and numbness.The remainder of the review of systems is negative in detail.Past Sleep Study  History: The results of the sleep study performed 03/2018:?SEVERE?Obstructive Sleep Apnea (G47.33): Diagnostic portion?AHI of 32/hr. (AHI 4% 22/hr.),?mean SpO2 85%, nadir SpO2 72%, 78% total recording time with SpO2 <88%.?There were cyclical events associated with significant oxygen desaturations.  Bethany Wiggins has a past medical history of A-fib (HC Code) (HC CODE) (HC Code), Anemia, Arthritis, CPAP (continuous positive airway pressure) dependence, Diabetes mellitus (HC Code) (HC CODE) (HC Code), GERD (gastroesophageal reflux disease), High cholesterol, History of crack cocaine use, Hypertension, Iron deficiency, Neuropathy (HC Code), and Obstructive sleep apnea.Bethany Wiggins has a past surgical history that includes Split Sleep Study (03/16/2018); Dental surgery; and Sleeve Gastroplasty (05/11/2020).Patient's family history includes Cervical cancer in her sister; Coronary Artery Disease in her father; Diabetes in her father, paternal aunt, and paternal uncle; Hypertension in her father, paternal aunt, and paternal uncle; Uterine cancer in her paternal aunt.Patient has a current medication list which includes the following prescription(s): allopurinoL (ZYLOPRIM) 100 mg tablet - Take 1 tablet (100 mg  total) by mouth daily, aspirin 81 mg EC delayed release tablet - Take 1 tablet (81 mg total) by mouth daily, calcium carb/vitamin D3/vit K1 (CALCIUM SOFT CHEW ORAL) - Take by mouth, cyclobenzaprine (FLEXERIL) 5 mg tablet - Take 1 tablet (5 mg total) by mouth 2 (two) times daily as needed for muscle spasms (or muscle cramps), fluticasone propionate (FLONASE) 50 mcg/actuation nasal spray - Use 2 sprays in each nostril daily, gabapentin (NEURONTIN) 600 mg tablet - Take 1 tablet (600 mg total) by mouth 3 (three) times daily with meals, levocetirizine (XYZAL) 5 mg tablet - Take 1 tablet (5 mg total) by mouth every evening before dinner, lidocaine (LIDODERM) 5 % - Place 2-3 patches onto the skin daily. Remove & Discard patch within 12 hours or as directed by MD, Miscellaneous Medical Supply - Please send 4 legged cane, for stability and chronic knee pain, Dx M25.561, multivitamin-min-iron-FA-vit K (BARIATRIC MULTIVITAMINS) 45 mg iron- 800 mcg-120 mcg Cap - Take by mouth, omeprazole (PRILOSEC) 20 mg capsule - Take 1 capsule (20 mg total) by mouth daily, rosuvastatin (CRESTOR) 20 mg tablet - Take 1 tablet (20 mg total) by mouth daily, semaglutide (OZEMPIC) 1 mg/dose (2 mg/1.5 mL) Pen Injector - Inject 1 mg under the skin once a week, simethicone (GAS-X ORAL) - Take by mouth, and triamterene-hydroCHLOROthiazide (DYAZIDE) 37.5-25 mg per capsule - Take 1 capsule by mouth every morning.Patient is allergic to lisinopril.Patient  reports that she quit smoking about 21 months ago. Her smoking use included cigarettes. She has a 4.00 pack-year smoking history. She has never used smokeless tobacco. She reports previous drug use. Drug: Cocaine. She reports that she does not drink alcohol.Physical Examination:Blood pressure 127/86, pulse 63, temperature 97.7 ?F (36.5 ?C), temperature source No-touch scanner, resp. rate 20, height 5' 3 (1.6 m), weight 134.3 kg, SpO2 99 %, not currently breastfeeding.  Body mass index is 52.43 kg/m?Marland Kitchen	General appearance - WD/WN   female  In no acute distressHEENT: Eyes: sclera white, Mouth: Lips pink and symmetrical, Chest: Respirations are even and non-labored. Neuro:  Gait and stance are normal. No limb ataxia. Psychiatric: Affect is appropriate. Language and speech are normal. Oriented to person, place, time, and purpose of visitIn Summary: I saw Bethany Wiggins, a 60 y.o. patient with hx of DM, HTN, in follow-up  for treatment of obstructive sleep apnea with BiPAP therapy at 22/18 CWP with heated humidification and tubing, using nasal pillows , DME provider is j&l. She would like to change nasal mask. She had stopped using PAP due to recall be we talked about it and she is willing to resume due to severity of apnea. Statement re PAP therapy per CMS requirements:The patient is compliant with PAP therapy per CMS guidelines.The patient is using and benefiting from PAP therapy with improved symptoms, specifically better sleep quality, better alertness as noted above. The patient plans to continue use of PAP therapy.Plan: 1)resume PAP, remote check in one month, supplies and mask refit to nasal mask at that time. Then follow up yearly. The health consequences of untreated/undertreated obstructive sleep apnea were explained to the patient,.Meet or exceed compliance parameters of 70% days used greater than 4 hours. Regular cleaning and replacement of CPAP supplies for maximum clinical results. Develop/Maintain good sleep hygiene characterized by regular bedtime routine and arise time with sufficient sleep time, avoidance of frequent or prolonged napping; Maintenance of patent oro-nasal airway. Be attentive to the cues of drowsy driving and to avoid driving or engaging in any potentially hazardous activities if at  all drowsy. Avoidance of alcohol with in 6 hours of bedtime; Avoidance of medications with sedative/hypnotic properties, unless prescribed, The patient understands and agrees with the plan of care.Milagros Reap, APRN, FNP-C;  NPI: [id number] Electronically signed July 24, 2020 2:26 PM

## 2020-07-24 NOTE — Patient Instructions
You saw Bethany Heinz, APRN today.She can be contacted via phone at [phone] Fax number is [phone].NONurgent issues. Usual MyChart email response time is 2-3 days.Ms. Bethany Wiggins is primarily at the [address] [facility] site. The phone number for this clinic is [phone]. Staff can provide directions and transmit messages to Ms. Bethany Wiggins.Urgent issues, scheduling questions, or questions about CPAP use should be directed to the main clinic in [facility] [phone].If you prefer to have appointments at the [facility] or [place] sites of the [employer] that can be arranged. You may need to be followed by a different sleep doctor.If you are having a sleep study or a CPAP adjustment you will have appointments in CPAP Clinic at our [facility] site at [address] in [facility].Sleep ApneaSleep apnea is a condition in which breathing pauses or becomes shallow during sleep. Episodes of sleep apnea usually last 10 seconds or longer. Sleep apnea disrupts your sleep and keeps your body from getting the rest that it needs. This condition can increase your risk of certain health problems, including:Heart attack.Stroke.Obesity.Diabetes.Heart failure.Irregular heartbeat.There are three kinds of sleep apnea:Obstructive sleep apnea. This kind is caused by a blocked or collapsed airway.Central sleep apnea. This kind happens when the part of the brain that controls breathing does not send the correct signals to the muscles that control breathing.Mixed sleep apnea. This is a combination of obstructive and central sleep apnea.What are the causes?The most common cause of this condition is a collapsed or blocked airway. An airway can collapse or become blocked MW:UXLK throat muscles are abnormally relaxed.Your tongue and tonsils are larger than normal.You are overweight.Your airway is smaller than normal.What increases the risk?This condition is more likely to develop in people who:Are overweight.Smoke.Have a smaller than normal airway.Are elderly.Are female.Drink alcohol.Take sedatives or tranquilizers.Have a family history of sleep apnea.What are the signs or symptoms?Symptoms of this condition include:Trouble staying asleep.Daytime sleepiness and tiredness.Irritability.Loud snoring.Morning headaches.Trouble concentrating.Forgetfulness.Decreased interest in sex.Unexplained sleepiness.Mood swings.Personality changes.Feelings of depression.Waking up often during the night to urinate.Dry mouth.Sore throat.How is this diagnosed?This condition may be diagnosed with:A medical history.A physical exam.A series of tests that are done while you are sleeping (sleep study). These tests are usually done in a sleep lab, but they may also be done at home.How is this treated?Treatment for this condition aims to restore normal breathing and to ease symptoms during sleep. It may involve managing health issues that can affect breathing, such as high blood pressure or obesity. Treatment may include:Sleeping on your side.Avoiding the use of depressants, including alcohol, sedatives, and narcotics.Losing weight if you are overweight.Using a device to open your airway while you sleep, such as:An oral appliance. This is a custom-made mouthpiece that shifts your lower jaw forward.A continuous positive airway pressure (CPAP) device. This device delivers oxygen to your airway through a mask.A bi-level positive airway pressure (BPAP) device. This device delivers oxygen to your airway through a mask.Surgery if other treatments do not work. During surgery, excess tissue is removed to create a wider airway.It is important to get treatment for sleep apnea. Without treatment, this condition can lead GM:WNUU blood pressure.Coronary artery disease.(Men) An inability to achieve or maintain an erection (impotence).Reduced thinking abilities.Follow these instructions at home:Make any lifestyle changes that your health care provider recommends.Eat a healthy, well-balanced diet.Take over-the-counter and prescription medicines only as told by your health care provider.Avoid using depressants, including alcohol, sedatives, and narcotics.Take steps to lose weight if you are overweight.If you were given a device to open  your airway while you sleep, use it only as told by your health care provider.Do not use any tobacco products, such as cigarettes, chewing tobacco, and e-cigarettes. If you need help quitting, ask your health care provider.Keep all follow-up visits as told by your health care provider. This is important.Contact a health care provider if:The device that you received to open your airway during sleep is uncomfortable or does not seem to be working.Your symptoms do not improve.Your symptoms get worse.Get help right away if:You develop chest pain.You develop shortness of breath.You develop discomfort in your back, arms, or stomach.You have trouble speaking.You have weakness on one side of your body.You have drooping in your face.These symptoms may represent a serious problem that is an emergency. Do not wait to see if the symptoms will go away. Get medical help right away. Call your local emergency services (911 in the [place] Do not drive yourself to the hospital.This information is not intended to replace advice given to you by your health care provider. Make sure you discuss any questions you have with your health care provider.Document Released: 03/22/2002 Document Revised: 10/28/2016 Document Reviewed: 09/26/2016Elsevier Interactive Patient Education ? 2019 Elsevier Inc.

## 2020-07-25 ENCOUNTER — Encounter: Admit: 2020-07-25 | Payer: MEDICAID | Attending: Family

## 2020-07-25 ENCOUNTER — Encounter: Admit: 2020-07-25 | Payer: PRIVATE HEALTH INSURANCE | Attending: Family

## 2020-07-25 DIAGNOSIS — K909 Intestinal malabsorption, unspecified: Secondary | ICD-10-CM

## 2020-07-25 DIAGNOSIS — E611 Iron deficiency: Secondary | ICD-10-CM

## 2020-07-25 DIAGNOSIS — E538 Deficiency of other specified B group vitamins: Secondary | ICD-10-CM

## 2020-07-25 DIAGNOSIS — I10 Essential (primary) hypertension: Secondary | ICD-10-CM

## 2020-07-25 DIAGNOSIS — Z9989 Dependence on other enabling machines and devices: Secondary | ICD-10-CM

## 2020-07-25 DIAGNOSIS — M1990 Unspecified osteoarthritis, unspecified site: Secondary | ICD-10-CM

## 2020-07-25 DIAGNOSIS — G629 Polyneuropathy, unspecified: Secondary | ICD-10-CM

## 2020-07-25 DIAGNOSIS — G4733 Obstructive sleep apnea (adult) (pediatric): Secondary | ICD-10-CM

## 2020-07-25 DIAGNOSIS — I4891 Unspecified atrial fibrillation: Secondary | ICD-10-CM

## 2020-07-25 DIAGNOSIS — Z87898 Personal history of other specified conditions: Secondary | ICD-10-CM

## 2020-07-25 DIAGNOSIS — D649 Anemia, unspecified: Secondary | ICD-10-CM

## 2020-07-25 DIAGNOSIS — K219 Gastro-esophageal reflux disease without esophagitis: Secondary | ICD-10-CM

## 2020-07-25 DIAGNOSIS — E119 Type 2 diabetes mellitus without complications: Secondary | ICD-10-CM

## 2020-07-25 DIAGNOSIS — E519 Thiamine deficiency, unspecified: Secondary | ICD-10-CM

## 2020-07-25 DIAGNOSIS — K912 Postsurgical malabsorption, not elsewhere classified: Secondary | ICD-10-CM

## 2020-07-25 DIAGNOSIS — E7800 Pure hypercholesterolemia, unspecified: Secondary | ICD-10-CM

## 2020-07-25 DIAGNOSIS — E213 Hyperparathyroidism, unspecified: Secondary | ICD-10-CM

## 2020-07-25 DIAGNOSIS — E559 Vitamin D deficiency, unspecified: Secondary | ICD-10-CM

## 2020-07-25 DIAGNOSIS — Z9884 Bariatric surgery status: Secondary | ICD-10-CM

## 2020-07-25 NOTE — Progress Notes
Confirmation of Verbal Order for Home Medical EquipmentRecertification Patient Name, Address, TelephoneBrenda Angelique Wiggins [address] [place]stein [phone]                                                                    Account#:Insured ID: [id number] - (Medicaid)               [id number] Location: [id number]             DOB: 02/01/1961  Supplier Name, Address, [facility] [address] [place] [address] [place], [place] [address] [phone]:  [phone]       [phone]  NSC or NPI# [id number] Date Prescribed:  10.25.2021                          Estimated Length of Need: 99 months  1-99(99=lifetime) Patient Diagnosis / ICD10 G47.33	Obstructive sleep apnea (adult) (pediatric) Equipment Prescribed (Including all related accessories are allowed  based on payer allowable);[id number]	Tubing Heated  1/3 [id number]	Filter, Ultra Fine  2 per [id number]	Nasal Pillows 2 x 1 [id number]	 Nasal or Mask Applctn Device 1 per 3 [id number]	Water Chamber for Humidifier PAP 1 per 6 months            Additional Rx Info: ________________________________________________________________________________________________________________________________________________________________________________________ Please verify by signing and dating that your patient meets the applicable coverage criteria for equipment your have prescribedPrescribing PractitionerWon, Gardiner Fanti, MDSleep Medicine Program At [address] [place] [address]HYWVP Number: [phone] Number: [id number]: [id number]   Physician signature: ELECTRONICALLY SIGNED BY DR. Arvid Right, MD, July 24, 2020 11:13 PMNPI # [id number]

## 2020-07-25 NOTE — Progress Notes
VIDEO TELEHEALTH VISIT: This clinician is part of the telehealth program and is conducting this visit in a currently approved location. For this visit the clinician and patient were present via interactive audio & video telecommunications system that permits real-time communications, via the [employer].Patient's use of the telehealth platform followed consent and acknowledges agreement to permit telehealth for this visit. State patient is located in: [place]The clinician is appropriately licensed in the above state to provide care for this visit. Other individuals present during the telehealth encounter and their role/relation: noneIf billing based on time, please complete (Not required if billing based on MDM):                           Total time spent in medical video consultation: 20; Total time spent by the provider on the day of service, which includes time spent on chart review, medical video consultation, education, coordination of care/services and counseling Because this visit was completed over video, a hands-on physical exam was not performed.  Patient/parent or guardian understands and knows to call back if condition changes.Chief Complaint Patient presents with ? Bariatric Follow-Up   sleeve eating and drinking well moving bowels not good HPI: Bethany Wiggins returns for her bariatric follow up visit today. The patient is 2 months s/p sleeve gastrectomy. Reports they are doing well. EBWL at today's visit is 19%. Bariatric Weight Loss Flowsheet 07/25/2020 Bariatric visit types Follow up Surgery type Sleeve gastrectomy Surgery Date 05/11/2020 Pre Op Weight 330 lbs Height 5' 3 Height notation  Weight 294 lbs BMI (Calculated) 52.2 Comments   Weight Loss (%) 10.91 Weight Loss (lb) 36 Ideal Weight 141 lbs % Excess Weight Loss 19  Today she weighs 133.4 kg and her current Body mass index is 52.08 kg/m?Marland Kitchen Weight 294 lbs.Her laparoscopic sleeve gastrectomy information: 05/11/20 with Dr. Gwenevere Abbot.Surgery Date: 05/11/20.Pre Op Weight: 149.7 kg.  Today, she does not complain about reflux.She never vomits.She exercises 2x a week. Her dietary compliance is excellent.Alcohol Use: no  	Frequency: N/ADrug Use: no 		Frequency: N/ANicotine history:no	Packs per day:  N/ANSAID use: no 	Frequency: N/AOral/IV steroid use: no Frequency: N/ATaking vitamins?  yesIf yes, which vitamins are he or she taking? bariatric multivitamin; calciumNeed for mental health referral: noPost Operative Co-morbidities Follow RU:EAVWU apnea: stableUsing CPAP: YesDiabetes Mellitus: yes	Insulin Dependent? noNumber of hypertension medications currently prescribed: 1On Hyperlipidemia medications: yesCurrently on medications for GERD? yes	If yes, it is due to symptoms? no	Is it due to protocol? yesCurrent Outpatient Medications on File Prior to Visit Medication Sig Dispense Refill ? allopurinoL (ZYLOPRIM) 100 mg tablet Take 1 tablet (100 mg total) by mouth daily. 90 tablet 1 ? aspirin 81 mg EC delayed release tablet Take 1 tablet (81 mg total) by mouth daily. 90 tablet 1 ? calcium carb/vitamin D3/vit K1 (CALCIUM SOFT CHEW ORAL) Take by mouth.    ? cyclobenzaprine (FLEXERIL) 5 mg tablet Take 1 tablet (5 mg total) by mouth 2 (two) times daily as needed for muscle spasms (or muscle cramps). 30 tablet 1 ? fluticasone propionate (FLONASE) 50 mcg/actuation nasal spray Use 2 sprays in each nostril daily. 16 g 3 ? gabapentin (NEURONTIN) 600 mg tablet Take 1 tablet (600 mg total) by mouth 3 (three) times daily with meals. 90 tablet 2 ? levocetirizine (XYZAL) 5 mg tablet Take 1 tablet (5 mg total) by mouth every evening before dinner. 90 tablet 1 ? lidocaine (LIDODERM) 5 % Place 2-3 patches onto the skin daily. Remove & Discard patch within 12 hours  or as directed by MD 90 patch 3 ? Miscellaneous Medical Supply Please send 4 legged cane, for stability and chronic knee pain, Dx M25.561 1 each 0 ? multivitamin-min-iron-FA-vit K (BARIATRIC MULTIVITAMINS) 45 mg iron- 800 mcg-120 mcg Cap Take by mouth.   ? omeprazole (PRILOSEC) 20 mg capsule Take 1 capsule (20 mg total) by mouth daily. 90 capsule 1 ? rosuvastatin (CRESTOR) 20 mg tablet Take 1 tablet (20 mg total) by mouth daily. 90 tablet 1 ? semaglutide (OZEMPIC) 1 mg/dose (2 mg/1.5 mL) Pen Injector Inject 1 mg under the skin once a week. 9 mL 0 ? simethicone (GAS-X ORAL) Take by mouth.   ? triamterene-hydroCHLOROthiazide (DYAZIDE) 37.5-25 mg per capsule Take 1 capsule by mouth every morning. 90 capsule 1 No current facility-administered medications on file prior to visit. Objective:Physical Exam:Ht 5' 3 (1.6 m)  - Wt 133.4 kg Comment: as per pt - BMI 52.08 kg/m?  General:  Alert, oriented, no acute distressLaboratory TestsRecent bariatric labs reviewed: noBariatric labs ordered today: yes - complete just prior to 6 month visitAssessment & Plan: Bethany Wiggins is doing well after her laparoscopic sleeve gastrectomy.My recommendations for Bethany Wiggins:PPIRJJOA with good dietary habits and regular exercise.Take Bariatric Multivitamin and Calcium daily.Order 6 months labs at [facility] [place].Consider/Continue attending support group.Follow up:Bethany Wiggins will follow up in 3 months for 6 month post op visit. Jana Hakim, APRNBariatric & Minimally Invasive Surgery Electronically Signed by Loura Pardon, APRN, July 25, 2020

## 2020-07-29 ENCOUNTER — Encounter: Admit: 2020-07-29 | Payer: PRIVATE HEALTH INSURANCE

## 2020-07-31 ENCOUNTER — Encounter: Admit: 2020-07-31 | Payer: MEDICAID | Attending: Family

## 2020-07-31 ENCOUNTER — Ambulatory Visit: Admit: 2020-07-31 | Payer: MEDICAID

## 2020-07-31 ENCOUNTER — Encounter: Admit: 2020-07-31 | Payer: PRIVATE HEALTH INSURANCE

## 2020-07-31 DIAGNOSIS — L02414 Cutaneous abscess of left upper limb: Secondary | ICD-10-CM

## 2020-07-31 DIAGNOSIS — M1990 Unspecified osteoarthritis, unspecified site: Secondary | ICD-10-CM

## 2020-07-31 DIAGNOSIS — E119 Type 2 diabetes mellitus without complications: Secondary | ICD-10-CM

## 2020-07-31 DIAGNOSIS — I10 Essential (primary) hypertension: Secondary | ICD-10-CM

## 2020-07-31 DIAGNOSIS — E611 Iron deficiency: Secondary | ICD-10-CM

## 2020-07-31 DIAGNOSIS — I4891 Unspecified atrial fibrillation: Secondary | ICD-10-CM

## 2020-07-31 DIAGNOSIS — G4733 Obstructive sleep apnea (adult) (pediatric): Secondary | ICD-10-CM

## 2020-07-31 DIAGNOSIS — Z87898 Personal history of other specified conditions: Secondary | ICD-10-CM

## 2020-07-31 DIAGNOSIS — Z9989 Dependence on other enabling machines and devices: Secondary | ICD-10-CM

## 2020-07-31 DIAGNOSIS — G629 Polyneuropathy, unspecified: Secondary | ICD-10-CM

## 2020-07-31 DIAGNOSIS — E7800 Pure hypercholesterolemia, unspecified: Secondary | ICD-10-CM

## 2020-07-31 DIAGNOSIS — D649 Anemia, unspecified: Secondary | ICD-10-CM

## 2020-07-31 DIAGNOSIS — K219 Gastro-esophageal reflux disease without esophagitis: Secondary | ICD-10-CM

## 2020-07-31 MED ORDER — DOXYCYCLINE HYCLATE 100 MG CAPSULE
100 mg | ORAL_CAPSULE | Freq: Two times a day (BID) | ORAL | 1 refills | Status: AC
Start: 2020-07-31 — End: ?

## 2020-07-31 NOTE — Progress Notes
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 [facility] [place] and exam as 2x2cm and is considerably smaller but still presenet and draining blood. She was put on antibiotics, Bactrim x 7d first then on Doxycycline x 10d, but her infection did not seem to resolve although it improved and the lesion is smaller and less painful. She reports still having blood and pus oozing out of the wound. She finished her last course of antibiotics about a week ago. The area still hurts. She said the abscess shrunk in size after taking the antibiotics but it returns after washing the area and having the scab fall off. She has been putting bacitracin on the wound and applying a dressingShe does not have any other problems, fever, diarrhea, etc. She has lost weight recently, she lost 80 lbs since her bariatric surgery in Jan. She was born in [place], [place] and moved to [place] at 60 yo. Answers for HPI/ROS submitted by the patient on 4/15/2022Weight loss: YesConstipation: YesJoint pain: YesPast Medical History: Diagnosis Date ? A-fib (HC Code) (HC CODE) (HC Code)  ? Anemia  ? Arthritis  ? CPAP (continuous positive airway pressure) dependence   has c-pap; doesnt use it ;  too much pressure ? Diabetes mellitus (HC Code) (HC CODE) (HC Code)  ? GERD (gastroesophageal reflux disease)  ? High cholesterol  ? History of crack cocaine use  ? Hypertension  ? Iron deficiency  ? Neuropathy (HC Code)  ? Obstructive sleep apnea  Past Surgical History: Procedure Laterality Date ? DENTAL SURGERY   ? SLEEVE GASTROPLASTY  05/11/2020 ? SPLIT SLEEP STUDY  03/16/2018 family history includes Cervical cancer in her sister; Coronary Artery Disease in her father; Diabetes in her father, paternal aunt, and paternal uncle; Hypertension in her father, paternal aunt, and paternal uncle; Uterine cancer in her paternal aunt. reports that she quit smoking about 21 months ago. Her smoking use included cigarettes. She has a 4.00 pack-year smoking history. She has never used smokeless tobacco. She reports previous drug use. Drug: Cocaine. She reports that she does not drink alcohol.ImmunizationsImmunization History Administered Date(s) Administered ? COVID-19 Vaccine - PFIZER 05/09/2019, 06/10/2019, 03/15/2020 ? Influenza, injectable, quadrivalent, preservative free 01/21/2018, 01/25/2019 ? Influenza, seasonal, injectable, preservative free 03/25/2017 ? Influenza, unspecified formulation 04/13/2020 ? Pneumococcal polysaccharide PPV23 01/21/2018 ? Tdap 11/24/2019 ? ZOSTER RECOMBINANT (Shingrix) 07/06/2020  MEDICATIONS Current Outpatient Medications on File Prior to Visit Medication Sig Dispense Refill ? allopurinoL (ZYLOPRIM) 100 mg tablet Take 1 tablet (100 mg total) by mouth daily. 90 tablet 1 ? aspirin 81 mg EC delayed release tablet Take 1 tablet (81 mg total) by mouth daily. 90 tablet 1 ? calcium carb/vitamin D3/vit K1 (CALCIUM SOFT CHEW ORAL) Take by mouth.    ? cyclobenzaprine (FLEXERIL) 5 mg tablet Take 1 tablet (5 mg total) by mouth 2 (two) times daily as needed for muscle spasms (or muscle cramps). 30 tablet 1 ? fluticasone propionate (FLONASE) 50 mcg/actuation nasal spray Use 2 sprays in each nostril daily. 16 g 3 ? gabapentin (NEURONTIN) 600 mg tablet Take 1 tablet (600 mg  total) by mouth 3 (three) times daily with meals. 90 tablet 2 ? levocetirizine (XYZAL) 5 mg tablet Take 1 tablet (5 mg total) by mouth every evening before dinner. 90 tablet 1 ? lidocaine (LIDODERM) 5 % Place 2-3 patches onto the skin daily. Remove & Discard patch within 12 hours or as directed by MD 90 patch 3 ? Miscellaneous Medical Supply Please send 4 legged cane, for stability and chronic knee pain, Dx M25.561 1 each 0 ? multivitamin-min-iron-FA-vit K (BARIATRIC MULTIVITAMINS) 45 mg iron- 800 mcg-120 mcg Cap Take by mouth.   ? omeprazole (PRILOSEC) 20 mg capsule Take 1 capsule (20 mg total) by mouth daily. 90 capsule 1 ? rosuvastatin (CRESTOR) 20 mg tablet Take 1 tablet (20 mg total) by mouth daily. 90 tablet 1 ? semaglutide (OZEMPIC) 1 mg/dose (2 mg/1.5 mL) Pen Injector Inject 1 mg under the skin once a week. 9 mL 0 ? simethicone (GAS-X ORAL) Take by mouth.   ? triamterene-hydroCHLOROthiazide (DYAZIDE) 37.5-25 mg per capsule Take 1 capsule by mouth every morning. 90 capsule 1 No current facility-administered medications on file prior to visit.  ALLERGIESAllergies Allergen Reactions ? Lisinopril Angioedema MISCELLANEOUS HISTORY Country of Origin:Countries of travel: Pets: noneHistory of farm Nature conservation officer residence:Job :	Review of SystemsGeneral : no fever, no chills, no weight lossHead: no significant hair lossEye  no eye discharge, no visual changesENT: no ear discharge, no hearing loss , no sore throat, no dizzinessPulmonary: no productive cough ,no SOB, no pleurisy, no wheezing,CVS :no history of lightheadedness, no palpitation, no chest pain, no history of murmur, no leg edema, no orthopneaGI: no dysphagia, no odynophagia, no  reflux,  no nausea , no  vomiting ,no early satiety,no jaundiceGU: no dysuria,  No frequency, no urgency, no hematuria, no dischargeMusculoskeletal: no muscle aches/pain, no joint swelling/ redness / painLymphadenopathy: no palpable nodesSkin: no rash nor new skin lesionNeurological: no loss of muscle strength , no sensory loss, no history of seizure, no history of dizziness, no headachePhysical ExamVitals:  07/31/20 1357 BP: 106/72 Pulse: 66 Resp: 16 Temp: 97.5 ?F (36.4 ?C) @TMAX (24)@Constitutional : alert, oriented to time, place and person, not in acute distress, Head: normocephalic, atraumaticEyes/throat: PERRLA, EOMI, sclera non-injected, no scleral icterus, conjunctivae normal,Neck: no cervical lymphadenopathyHeart: RRRLungs: clear to auscultation, no wheeze,Abdomen: softly distended, nExtremities: no cyanosis/clubbing/edema, no muscle atrophyMusculoskeletal: no muscle tenderness, no joint swelling/tendernessNeuro: CN II-XII grossly intact,  sensation intact, motor strength 4/4 on all extremities, reflexes + 2, ambulatory,Skin:  Sub-mm drainage of frNK BLOOD FROM SMALL NODULE ON DORSAL ASPECT OF l FORARM ABOUT 10CM FROM ELBOW. eLBOW WITH from, NO TENDERNESS. nO SWELLING, REDNESS, WARMTH OF SKIN Pysch: calm, cooperative, answers questions appropriatelyLABORATORY DATALab Results Component Value Date  WBC 6.8 05/13/2020  HGB 10.6 (L) 05/13/2020  HCT 33.60 (L) 05/13/2020  MCV 80.0 05/13/2020  PLT 310 05/13/2020 Lab Results Component Value Date  CREATININE 0.70 05/13/2020  BUN 7 05/13/2020  NA 140 05/13/2020  K 3.5 05/13/2020  CL 100 05/13/2020  CO2 32 (H) 05/13/2020 CrCl cannot be calculated (Patient's most recent lab result is older than the maximum 30 days allowed.).Lab Results Component Value Date  ALT 14 01/25/2019  AST 14 01/25/2019  ALKPHOS 55 01/25/2019  BILITOT 0.3 01/25/2019 MicrobiologyRadiologyAssessmentPatient is a 60 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 This was imaged with ultrasound in the emergency room  and drained x2 with no micro growth. She has had two rounds of antibiotics, bactrim for seven days followed by doxycyclin for 10 days without complete resolution although on exam the Abscess is considerably smaller than described in the ED a month ago.Blood is expressible and it is tender but there is surrounding cellulitis the drainage is small from a 2 millimeter skin break. She is also putting bacitracin and hydrogen peroxide on it. There are no other concerning lesions surrounding the absence and she has had no systemic symptoms. Of note you had recent bariatric surgery and as last about 80 pounds. She also has fairly well controlled diabetes wih Hgb-A1C of <7%. I think that the best approach is to allow the open Abscess to scab over by exposing it to air and keeping it dry so I counseled not to use bacitracin and to keep it dry and out of the shower. She can use the gauze bandage but nothing else. I have restarted two weeks of doxycycline to eradicate any further presumed bacteria to allow the Abscess to heal and counseled her that it is likely she will have a small pea sized fibrotic nodule left once healings occurred. She will call back if she needs further input.Recommendation Loney Loh, MD PhDSection of Infectious DiseaseDepartment of Internal Medicine4/18/2022  2:15 PMScribed for Robb Matar, MD PhD by Sheryn Bison, medical scribe July 31, 2020 The documentation recorded by the scribe accurately reflects the services I personally performed and the decisions made by me. I reviewed and confirmed all material entered and/or pre-charted by the scribe.

## 2020-08-01 DIAGNOSIS — L0291 Cutaneous abscess, unspecified: Secondary | ICD-10-CM

## 2020-08-09 ENCOUNTER — Telehealth: Admit: 2020-08-09 | Payer: PRIVATE HEALTH INSURANCE

## 2020-08-09 NOTE — Telephone Encounter
PC to patient to schedule wt/ht check. However, no answer. Left VM with call back number for patient to call our office to schedule the appointment.

## 2020-08-10 ENCOUNTER — Encounter: Admit: 2020-08-10 | Payer: MEDICAID

## 2020-08-21 ENCOUNTER — Encounter: Admit: 2020-08-21 | Payer: PRIVATE HEALTH INSURANCE | Attending: Family

## 2020-08-21 NOTE — Progress Notes
The pt called asking if Bethany Wiggins would please order hte nasal mask you spoke about at her last OV.

## 2020-08-22 ENCOUNTER — Encounter: Admit: 2020-08-22 | Payer: PRIVATE HEALTH INSURANCE | Attending: Family

## 2020-08-24 ENCOUNTER — Telehealth: Admit: 2020-08-24 | Payer: PRIVATE HEALTH INSURANCE

## 2020-08-24 NOTE — Telephone Encounter
[facility] MESSAGETime of call:   9:17 AMCaller:   BrendaCaller's relationship to patient:  Horticulturist, commercial from (pharmacy, hospital, agency, etc.):  n/a   Reason for call:  Patient started antibiotics on 08/01/20 for 2 full weeks and is calling because her symptoms have returned.  She had one week of relief after finishing the antibiotics.If not feeling well, what are symptoms:  The wound on her arm is open again and the pain is a 5 out of 10 with 10 being the worst.   If having symptoms, how long have the symptoms been present:  5 days.   Does caller request to speak to someone urgently?  yes   Best telephone number for callback:  [phone] time to return call:  AnytimePermission to leave message:  yes   [facility] Referral Specialist

## 2020-08-24 NOTE — Telephone Encounter
Called and spoke with the pt who reports increased pain and discharge from her left arm abscess. Pt stated that she completed her 14 day course of Doxycycline and the site scabbed over. Then after approximately a week and a half the area became raised burst and the oozing of pus and blood returned. Pt denies fever and chills.  The pt accepted an urgent appointment with Jaymes Graff tomorrow at 11:00 am. ED precautions also reviewed. The pt verbalized an understanding of the plan.

## 2020-08-25 ENCOUNTER — Ambulatory Visit: Admit: 2020-08-25 | Payer: MEDICAID | Attending: Internal Medicine

## 2020-08-25 ENCOUNTER — Encounter: Admit: 2020-08-25 | Payer: PRIVATE HEALTH INSURANCE | Attending: Internal Medicine

## 2020-08-25 DIAGNOSIS — I10 Essential (primary) hypertension: Secondary | ICD-10-CM

## 2020-08-25 DIAGNOSIS — D649 Anemia, unspecified: Secondary | ICD-10-CM

## 2020-08-25 DIAGNOSIS — I4891 Unspecified atrial fibrillation: Secondary | ICD-10-CM

## 2020-08-25 DIAGNOSIS — K219 Gastro-esophageal reflux disease without esophagitis: Secondary | ICD-10-CM

## 2020-08-25 DIAGNOSIS — E7800 Pure hypercholesterolemia, unspecified: Secondary | ICD-10-CM

## 2020-08-25 DIAGNOSIS — G4733 Obstructive sleep apnea (adult) (pediatric): Secondary | ICD-10-CM

## 2020-08-25 DIAGNOSIS — M1990 Unspecified osteoarthritis, unspecified site: Secondary | ICD-10-CM

## 2020-08-25 DIAGNOSIS — E119 Type 2 diabetes mellitus without complications: Secondary | ICD-10-CM

## 2020-08-25 DIAGNOSIS — E611 Iron deficiency: Secondary | ICD-10-CM

## 2020-08-25 DIAGNOSIS — Z9989 Dependence on other enabling machines and devices: Secondary | ICD-10-CM

## 2020-08-25 DIAGNOSIS — G629 Polyneuropathy, unspecified: Secondary | ICD-10-CM

## 2020-08-25 DIAGNOSIS — Z87898 Personal history of other specified conditions: Secondary | ICD-10-CM

## 2020-08-25 DIAGNOSIS — L0291 Cutaneous abscess, unspecified: Secondary | ICD-10-CM

## 2020-08-25 MED ORDER — CEPHALEXIN 500 MG CAPSULE
500 mg | ORAL_CAPSULE | Freq: Four times a day (QID) | ORAL | 1 refills | Status: AC
Start: 2020-08-25 — End: 2021-01-08

## 2020-08-25 NOTE — Progress Notes
[facility] - Infectious Diseases Center - [facility]       Program Director: Kelle Darting, MD, FACP, AAHIVSJoseph Martie Round, MD Loretha Stapler, MD, PhD Thilinie D. [place], MBBS Netty Starring, PA-C, AAHIVS Faylene Million, MD, FACP, AAHIVS [place], PA-C Nani Skillern, MD, BS, AAHIVS Paulla Fore, APRN, AAHIVS [place]ery [place], MD, PhD  		Urgent visit1. 60 yo female with recurrent L forearm abscess.  Initially seen in ED and started on abx.  Seemed to respond better to doxycycline. Finished course by dr. Frazier Richards and swelling and pus collection restarted.   culture was inconclusive, and so no real guidance for abx selection.Current Outpatient Medications Medication Sig Dispense Refill ? allopurinoL (ZYLOPRIM) 100 mg tablet Take 1 tablet (100 mg total) by mouth daily. 90 tablet 1 ? aspirin 81 mg EC delayed release tablet Take 1 tablet (81 mg total) by mouth daily. 90 tablet 1 ? calcium carb/vitamin D3/vit K1 (CALCIUM SOFT CHEW ORAL) Take by mouth.    ? cephALEXin (KEFLEX) 500 mg capsule Take 1 capsule (500 mg total) by mouth 4 (four) times daily. 40 capsule 0 ? cyclobenzaprine (FLEXERIL) 5 mg tablet Take 1 tablet (5 mg total) by mouth 2 (two) times daily as needed for muscle spasms (or muscle cramps). 30 tablet 1 ? fluticasone propionate (FLONASE) 50 mcg/actuation nasal spray Use 2 sprays in each nostril daily. 16 g 3 ? gabapentin (NEURONTIN) 600 mg tablet Take 1 tablet (600 mg total) by mouth 3 (three) times daily with meals. 90 tablet 2 ? levocetirizine (XYZAL) 5 mg tablet Take 1 tablet (5 mg total) by mouth every evening before dinner. 90 tablet 1 ? lidocaine (LIDODERM) 5 % Place 2-3 patches onto the skin daily. Remove & Discard patch within 12 hours or as directed by MD 90 patch 3 ? Miscellaneous Medical Supply Please send 4 legged cane, for stability and chronic knee pain, Dx M25.561 1 each 0 ? multivitamin-min-iron-FA-vit K (BARIATRIC MULTIVITAMINS) 45 mg iron- 800 mcg-120 mcg Cap Take by mouth.   ? omeprazole (PRILOSEC) 20 mg capsule Take 1 capsule (20 mg total) by mouth daily. 90 capsule 1 ? rosuvastatin (CRESTOR) 20 mg tablet Take 1 tablet (20 mg total) by mouth daily. 90 tablet 1 ? semaglutide (OZEMPIC) 1 mg/dose (2 mg/1.5 mL) Pen Injector Inject 1 mg under the skin once a week. 9 mL 0 ? simethicone (GAS-X ORAL) Take by mouth.   ? triamterene-hydroCHLOROthiazide (DYAZIDE) 37.5-25 mg per capsule Take 1 capsule by mouth every morning. 90 capsule 1 No current facility-administered medications for this visit. Past Medical History: Diagnosis Date ? A-fib (HC Code) (HC CODE) (HC Code)  ? Anemia  ? Arthritis  ? CPAP (continuous positive airway pressure) dependence   has c-pap; doesnt use it ;  too much pressure ? Diabetes mellitus (HC Code) (HC CODE) (HC Code)  ? GERD (gastroesophageal reflux disease)  ? High cholesterol  ? History of crack cocaine use  ? Hypertension  ? Iron deficiency  ? Neuropathy (HC Code)  ? Obstructive sleep apnea  Ros:  Open wouns to L forearm.   pus already expressed.  Very little pain.  No fevers.O:Vitals:  08/25/20 1100 BP: 99/66 Pulse: 65 Resp: 20 Temp: 97.5 ?F (36.4 ?C) nadSkin: L forearm with 3cm x 1.5 cm open wound.  Fairly superficial.  Doesn't probe.  no surrounding erythema.Assessment & Plan 1. Cellulitis: abscess is expressed.  no current pus.  No surrounding erythema.   area not warm.  Collected culture.   patient asked to trial  keflex after having success with a previous abscess episode rather than my recommended doxycycline.  Will start with keflex 500 qid for ten days.   Will change with culture guided evidence.   Will plan fu with dr. Frazier Richards in two weeks.   Sooner with [place] or ED if increased s/s.

## 2020-08-28 LAB — SUPERFICIAL WOUND CULTURE     (BH GH LMW YH)
BKR GRAM STAIN (ROUTINE): NONE SEEN
BKR SUPERFICIAL WOUND CULTURE: NO GROWTH

## 2020-09-01 ENCOUNTER — Encounter: Admit: 2020-09-01 | Payer: PRIVATE HEALTH INSURANCE | Attending: Family

## 2020-09-04 ENCOUNTER — Encounter: Admit: 2020-09-04 | Payer: PRIVATE HEALTH INSURANCE | Attending: Family

## 2020-09-05 ENCOUNTER — Encounter: Admit: 2020-09-05 | Payer: PRIVATE HEALTH INSURANCE | Attending: Family

## 2020-09-05 ENCOUNTER — Ambulatory Visit: Admit: 2020-09-05 | Payer: MEDICAID

## 2020-09-05 ENCOUNTER — Encounter: Admit: 2020-09-05 | Payer: PRIVATE HEALTH INSURANCE

## 2020-09-05 DIAGNOSIS — K219 Gastro-esophageal reflux disease without esophagitis: Secondary | ICD-10-CM

## 2020-09-05 DIAGNOSIS — Z87898 Personal history of other specified conditions: Secondary | ICD-10-CM

## 2020-09-05 DIAGNOSIS — G629 Polyneuropathy, unspecified: Secondary | ICD-10-CM

## 2020-09-05 DIAGNOSIS — E611 Iron deficiency: Secondary | ICD-10-CM

## 2020-09-05 DIAGNOSIS — M1990 Unspecified osteoarthritis, unspecified site: Secondary | ICD-10-CM

## 2020-09-05 DIAGNOSIS — G4733 Obstructive sleep apnea (adult) (pediatric): Secondary | ICD-10-CM

## 2020-09-05 DIAGNOSIS — D649 Anemia, unspecified: Secondary | ICD-10-CM

## 2020-09-05 DIAGNOSIS — I10 Essential (primary) hypertension: Secondary | ICD-10-CM

## 2020-09-05 DIAGNOSIS — I4891 Unspecified atrial fibrillation: Secondary | ICD-10-CM

## 2020-09-05 DIAGNOSIS — Z9989 Dependence on other enabling machines and devices: Secondary | ICD-10-CM

## 2020-09-05 DIAGNOSIS — E7800 Pure hypercholesterolemia, unspecified: Secondary | ICD-10-CM

## 2020-09-05 DIAGNOSIS — E119 Type 2 diabetes mellitus without complications: Secondary | ICD-10-CM

## 2020-09-05 NOTE — Progress Notes
Confirmation of Verbal Order for Home Medical EquipmentRecertification Patient Name, Address, TelephoneBrenda Angelique Wiggins [address] [place] [address] [phone]                                                                    Account#:Insured ID: [id number] - (Medicaid)                  [id number] Location: [id number]                    DOB: Jun 12, 1960  Supplier Name, Address, [facility] [address] [place], [name] [address] Phone: [phone]: [phone] or NPI#: [id number] Date Prescribed: 02/06/2021                            Estimated Length of Need: 99 months  1-99(99=lifetime) Patient Diagnosis / ICD10 G47.33	Obstructive sleep apnea (adult) (pediatric) Equipment Prescribed (Including all related accessories are allowed  based on payer allowable);A4604	Tubing Heated  1/3 [id number]	Nasal Pillows 2 x 1 [id number]	 Nasal or Mask Applctn Device 1 per 3 [id number]	Filter, Ultra Fine  2 per [id number]	Water Chamber for Humidifier PAP 1 per 6 months            Additional Rx Info: ________________________________________________________________________________________________________________________________________________________________________________________ Please verify by signing and dating that your patient meets the applicable coverage criteria for equipment your have prescribedPrescribing PractitionerWrobel, Ros[facility] [address] [place]  [id number] Number: [phone] Number: [phone]: [id number]   Physician signature: Milagros Reap, APRN, FNP-C;  NPI: [id number] Electronically signed Sep 05, 2020 3:23 PM

## 2020-09-05 NOTE — Progress Notes
Confirmation of Verbal Order for Home Medical EquipmentRecertification Patient Name, Address, TelephoneBrenda Angelique Blonder [address] [place]  [phone]                                                                    Account#:Insured ID: [id number] - (Medicaid)                  [id number] Location: [id number]                    DOB: 06-08-1960  Supplier Name, Address, [facility] [address] [place], [name] [address] Phone: [phone]: [phone] or NPI#: [id number] Date Prescribed: 02/06/2021                            Estimated Length of Need: 99 months  1-99(99=lifetime) Patient Diagnosis / ICD10 G47.33	Obstructive sleep apnea (adult) (pediatric) Equipment Prescribed (Including all related accessories are allowed  based on payer allowable);A4604	Tubing Heated  1/3 [id number]	Nasal Pillows 2 x 1 [id number]	 Nasal or Mask Applctn Device 1 per 3 [id number]	Filter, Ultra Fine  2 per [id number]	Water Chamber for Humidifier PAP 1 per 6 months            Additional Rx Info: ________________________________________________________________________________________________________________________________________________________________________________________ Please verify by signing and dating that your patient meets the applicable coverage criteria for equipment your have prescribedPrescribing PractitionerWrobel, Rosita [facility] [name] [address]TDDUK Number: [phone] Number: [phone]: [id number]   Physician signature:

## 2020-09-15 ENCOUNTER — Encounter: Admit: 2020-09-15 | Payer: PRIVATE HEALTH INSURANCE | Attending: Family

## 2020-10-06 ENCOUNTER — Ambulatory Visit: Admit: 2020-10-06 | Payer: MEDICAID

## 2020-10-11 ENCOUNTER — Encounter: Admit: 2020-10-11 | Payer: PRIVATE HEALTH INSURANCE | Attending: Family

## 2020-10-11 NOTE — Progress Notes
Confirmation of Verbal Order for Home Medical Equipment                                     Initial                                       Patient Name, Address, TelephoneWILSON,[name][name], [name] [phone]                                                                    Account#:Insured ID: [id number]                             [id number] Location:                                              DOB:   Supplier Name, Address, Telephone  [facility]   [address] [place], [place] [id number]       [phone]                    90-01-88-11                                       01-10-1961                      NSC or QIO#:[id number] Date Prescribed:  07-31-2020  Date Revised:                 Estimated Length of Need: 99 months  1-99(99=lifetime) Patient Diagnosis:         G47.33 Equipment Prescribed (Including all related accessories are allowed  based on payer allowable);              Additional Rx Info: ________________________________________________________________________________________________________________________________________________________________________________________ Please verify by signing and dating that your patient meets the applicable coverage criteria for equipment your have prescribedPrescribing Practitioner   Jyl Heinz APRN                                           NPI [id number]                                                [address]                                                [place] [address]                                                   [phone]               ___Jessie Mal Amabile, APRN, FNP-C;  NPI: [id number] Electronically signed October 11, 2020 11:51 AM__________________________                 Jyl Heinz APRN  Sleep med                      Date:_______________________

## 2020-10-11 NOTE — Progress Notes
Confirmation of Verbal Order for Home Medical Equipment                                     Initial                                       Patient Name, Address, TelephoneWILSON,[name][name], [name] [phone]                                                                    Account#:Insured ID: [id number]                             [id number] Location:                                              DOB:   Supplier Name, Address, Telephone  [facility]   [address] [place], [place] [id number]       [phone]                    90-01-88-11                                       1960/08/30                      NSC or TKZ#:[id number] Date Prescribed:  07-31-2020  Date Revised:                 Estimated Length of Need: 99 months  1-99(99=lifetime) Patient Diagnosis:         G47.33 Equipment Prescribed (Including all related accessories are allowed  based on payer allowable);              Additional Rx Info: ________________________________________________________________________________________________________________________________________________________________________________________ Please verify by signing and dating that your patient meets the applicable coverage criteria for equipment your have prescribedPrescribing Practitioner   Jyl Heinz APRN                                           NPI [id number]                                                [address]                                                [place] [address]                                                   [phone]  [phone]               _____________________________                 Jyl Heinz APRN  Sleep med                      Date:_______________________

## 2020-10-13 ENCOUNTER — Encounter: Admit: 2020-10-13 | Payer: PRIVATE HEALTH INSURANCE | Attending: Family

## 2020-10-13 DIAGNOSIS — G4733 Obstructive sleep apnea (adult) (pediatric): Secondary | ICD-10-CM

## 2020-10-13 NOTE — Progress Notes
Will order or mask refit from [facility] and order for supplies.

## 2020-10-17 ENCOUNTER — Encounter: Admit: 2020-10-17 | Payer: MEDICAID | Attending: Family

## 2020-10-17 ENCOUNTER — Encounter: Admit: 2020-10-17 | Payer: PRIVATE HEALTH INSURANCE | Attending: Family

## 2020-10-17 DIAGNOSIS — G629 Polyneuropathy, unspecified: Secondary | ICD-10-CM

## 2020-10-17 DIAGNOSIS — E119 Type 2 diabetes mellitus without complications: Secondary | ICD-10-CM

## 2020-10-17 DIAGNOSIS — I10 Essential (primary) hypertension: Secondary | ICD-10-CM

## 2020-10-17 DIAGNOSIS — E7800 Pure hypercholesterolemia, unspecified: Secondary | ICD-10-CM

## 2020-10-17 DIAGNOSIS — Z9884 Bariatric surgery status: Secondary | ICD-10-CM

## 2020-10-17 DIAGNOSIS — G4733 Obstructive sleep apnea (adult) (pediatric): Secondary | ICD-10-CM

## 2020-10-17 DIAGNOSIS — Z87898 Personal history of other specified conditions: Secondary | ICD-10-CM

## 2020-10-17 DIAGNOSIS — I4891 Unspecified atrial fibrillation: Secondary | ICD-10-CM

## 2020-10-17 DIAGNOSIS — E611 Iron deficiency: Secondary | ICD-10-CM

## 2020-10-17 DIAGNOSIS — D649 Anemia, unspecified: Secondary | ICD-10-CM

## 2020-10-17 DIAGNOSIS — K219 Gastro-esophageal reflux disease without esophagitis: Secondary | ICD-10-CM

## 2020-10-17 DIAGNOSIS — M1990 Unspecified osteoarthritis, unspecified site: Secondary | ICD-10-CM

## 2020-10-17 DIAGNOSIS — Z9989 Dependence on other enabling machines and devices: Secondary | ICD-10-CM

## 2020-10-17 NOTE — Progress Notes
VIDEO TELEHEALTH VISIT: This clinician is part of the telehealth program and is conducting this visit in a currently approved location. For this visit the clinician and patient were present via interactive audio & video telecommunications system that permits real-time communications, via the [employer].Patient's use of the telehealth platform followed consent and acknowledges agreement to permit telehealth for this visit. State patient is located in: [place] clinician is appropriately licensed in the above state to provide care for this visit. Other individuals present during the telehealth encounter and their role/relation: noneIf billing based on time, please complete (Not required if billing based on MDM):                           Total time spent in medical video consultation: 20; Total time spent by the provider on the day of service, which includes time spent on chart review, medical video consultation, education, coordination of care/services and counseling Because this visit was completed over video, a hands-on physical exam was not performed.  Patient/parent or guardian understands and knows to call back if condition changes.Chief Complaint Patient presents with ? Bariatric Follow-Up   Sleeve eating and drinking well moving bowels with prune juice detox teas  HPI: Bethany Wiggins returns for her bariatric follow up visit today. The patient is 6 months s/p sleeve gastrectomy. Reports they are doing well. EBWL at today's visit is 29%. Bariatric Weight Loss Flowsheet 10/17/2020 Bariatric visit types Follow up Surgery type Sleeve gastrectomy Surgery Date 05/11/2020 Pre Op Weight 330 lbs Height 5' 3 Height notation  Weight 275 lbs 3 oz Weight Notation  BMI (Calculated) 48.8 Comments   Weight Loss (%) 16.61 Weight Loss (lb) 54.8 Ideal Weight 141 lbs % Excess Weight Loss 29  Today she weighs 124.8 kg and her current Body mass index is 48.75 kg/m?Marland Kitchen Weight 275.2 lbs.Her laparoscopic sleeve gastrectomy information: Dr. Gwenevere Abbot.Surgery Date: 05/11/20.Pre Op Weight: 149.7 kg.  Today, she does not complain about reflux.She never vomits.She exercises 3-4 times per week.Her dietary compliance is good.Alcohol Use: no  	Frequency: N/ADrug Use: no 		Frequency: N/ANicotine history:no	Packs per day:  N/ANSAID use: no 	Frequency: N/AOral/IV steroid use: no Frequency: N/ATaking vitamins?  yesIf yes, which vitamins are he or she taking? bariatric multivitamin; calciumNeed for mental health referral: noPost Operative Co-morbidities Follow AV:WUJWJ apnea: stableUsing CPAP: YesDiabetes Mellitus: yes	Insulin Dependent? noNumber of hypertension medications currently prescribed: 1On Hyperlipidemia medications: yesCurrently on medications for GERD? yes	If yes, it is due to symptoms? no	Is it due to protocol? yesCurrent Outpatient Medications on File Prior to Visit Medication Sig Dispense Refill ? allopurinoL (ZYLOPRIM) 100 mg tablet Take 1 tablet (100 mg total) by mouth daily. 90 tablet 1 ? aspirin 81 mg EC delayed release tablet Take 1 tablet (81 mg total) by mouth daily. 90 tablet 1 ? calcium carb/vitamin D3/vit K1 (CALCIUM SOFT CHEW ORAL) Take by mouth.    ? cephALEXin (KEFLEX) 500 mg capsule Take 1 capsule (500 mg total) by mouth 4 (four) times daily. 40 capsule 0 ? cyclobenzaprine (FLEXERIL) 5 mg tablet Take 1 tablet (5 mg total) by mouth 2 (two) times daily as needed for muscle spasms (or muscle cramps). 30 tablet 1 ? fluticasone propionate (FLONASE) 50 mcg/actuation nasal spray Use 2 sprays in each nostril daily. 16 g 3 ? gabapentin (NEURONTIN) 600 mg tablet Take 1 tablet (600 mg total) by mouth 3 (three) times daily with meals. 90 tablet 2 ? levocetirizine (XYZAL) 5 mg tablet Take 1 tablet (5 mg total)  by mouth every evening before dinner. 90 tablet 1 ? lidocaine (LIDODERM) 5 % Place 2-3 patches onto the skin daily. Remove & Discard patch within 12 hours or as directed by MD 90 patch 3 ? Miscellaneous Medical Supply Please send 4 legged cane, for stability and chronic knee pain, Dx M25.561 1 each 0 ? [employer] Adult incontinence pads once a day as needed. Dx: W09.811 28 each 3 ? multivitamin-min-iron-FA-vit K (BARIATRIC MULTIVITAMINS) 45 mg iron- 800 mcg-120 mcg Cap Take by mouth.   ? omeprazole (PRILOSEC) 20 mg capsule Take 1 capsule (20 mg total) by mouth daily. 90 capsule 1 ? polyethylene glycol (MIRALAX) 17 gram packet Take 1 packet (17 g total) by mouth daily. Mix in 8 ounces of water, juice, soda, coffee or tea prior to taking. 90 each 2 ? rosuvastatin (CRESTOR) 20 mg tablet Take 1 tablet (20 mg total) by mouth daily. 90 tablet 1 ? semaglutide (OZEMPIC) 1 mg/dose (2 mg/1.5 mL) Pen Injector Inject 1 mg under the skin once a week. 9 mL 0 ? simethicone (GAS-X ORAL) Take by mouth.   ? triamterene-hydroCHLOROthiazide (DYAZIDE) 37.5-25 mg per capsule Take 1 capsule by mouth every morning. 90 capsule 1 No current facility-administered medications on file prior to visit. Objective:Physical Exam:Ht 5' 3 (1.6 m)  - Wt 124.8 kg  - BMI 48.75 kg/m?  General:  Alert, oriented, no acute distressLaboratory TestsRecent bariatric labs reviewed: lab orders in system, not completed yetBariatric labs ordered today: noAssessment & Plan: Bethany Wiggins is doing well after her laparoscopic sleeve gastrectomy. Congratulated on weight loss so far! Reviewed that they are at 29% excess body weight loss today which is below our target of 50% EBWL for this 6 month post op visit. However, she also lost 40-50 lbs during the pre op process. My recommendations for Bethany Wiggins are:She was 372 lbs at seminar. She is 275 lbs today. She is feeling good with weight loss at this point. Continue with good dietary habits and regular exercise.Take Bariatric Multivitamin and Calcium daily.Complete 6 month labs at [facility] . Orders in system. Consider/Continue attending support group.Follow up:Bethany Wiggins will follow up in 6 months for 1 year post op visit. Jana Hakim, APRNBariatric & Minimally Invasive Surgery Electronically Signed by Loura Pardon, APRN, October 17, 2020

## 2020-10-18 ENCOUNTER — Inpatient Hospital Stay: Admit: 2020-10-18 | Discharge: 2020-10-18 | Payer: MEDICAID

## 2020-10-18 DIAGNOSIS — E559 Vitamin D deficiency, unspecified: Secondary | ICD-10-CM

## 2020-10-18 DIAGNOSIS — E611 Iron deficiency: Secondary | ICD-10-CM

## 2020-10-18 DIAGNOSIS — K912 Postsurgical malabsorption, not elsewhere classified: Secondary | ICD-10-CM

## 2020-10-18 DIAGNOSIS — K909 Intestinal malabsorption, unspecified: Secondary | ICD-10-CM

## 2020-10-18 DIAGNOSIS — E538 Deficiency of other specified B group vitamins: Secondary | ICD-10-CM

## 2020-10-18 DIAGNOSIS — Z9884 Bariatric surgery status: Secondary | ICD-10-CM

## 2020-10-18 DIAGNOSIS — E213 Hyperparathyroidism, unspecified: Secondary | ICD-10-CM

## 2020-10-18 DIAGNOSIS — E519 Thiamine deficiency, unspecified: Secondary | ICD-10-CM

## 2020-10-18 LAB — CBC WITH AUTO DIFFERENTIAL
BKR WAM ABSOLUTE IMMATURE GRANULOCYTES.: 0.01 x 1000/??L (ref 0.00–0.30)
BKR WAM ABSOLUTE LYMPHOCYTE COUNT.: 2.24 x 1000/??L (ref 0.60–3.70)
BKR WAM ABSOLUTE NRBC (2 DEC): 0 x 1000/??L (ref 0.00–1.00)
BKR WAM BASOPHIL ABSOLUTE COUNT.: 0.03 x 1000/??L (ref 0.00–1.00)
BKR WAM BASOPHILS: 0.5 % (ref 0.0–1.4)
BKR WAM EOSINOPHIL ABSOLUTE COUNT.: 0.19 x 1000/??L (ref 0.00–1.00)
BKR WAM EOSINOPHILS: 3.4 % (ref 0.0–5.0)
BKR WAM HEMATOCRIT (2 DEC): 36.7 % (ref 35.00–45.00)
BKR WAM HEMOGLOBIN: 12.5 g/dL (ref 11.7–15.5)
BKR WAM IMMATURE GRANULOCYTES: 0.2 % (ref 0.0–1.0)
BKR WAM MCH (PG): 26.4 pg — ABNORMAL LOW (ref 27.0–33.0)
BKR WAM MCHC: 34.1 g/dL (ref 31.0–36.0)
BKR WAM MCV: 77.4 fL — ABNORMAL LOW (ref 80.0–100.0)
BKR WAM MONOCYTE ABSOLUTE COUNT.: 0.29 x 1000/??L (ref 0.00–1.00)
BKR WAM MONOCYTES: 5.2 % (ref 4.0–12.0)
BKR WAM MPV: 9.1 fL (ref 8.0–12.0)
BKR WAM NEUTROPHILS: 50.8 % (ref 39.0–72.0)
BKR WAM NUCLEATED RED BLOOD CELLS: 0 % (ref 0.0–1.0)
BKR WAM PLATELETS: 349 x1000/ÂµL (ref 150–420)
BKR WAM RDW-CV: 15.4 % — ABNORMAL HIGH (ref 11.0–15.0)
BKR WAM RED BLOOD CELL COUNT.: 4.74 M/??L (ref 4.00–6.00)
BKR WAM WHITE BLOOD CELL COUNT: 5.6 x1000/??L (ref 4.0–11.0)

## 2020-10-18 LAB — COMPREHENSIVE METABOLIC PANEL
BKR A/G RATIO: 1.3 (ref 1.0–2.2)
BKR ALANINE AMINOTRANSFERASE (ALT): 18 U/L (ref 10–35)
BKR ALBUMIN: 4.3 g/dL (ref 3.6–4.9)
BKR ALKALINE PHOSPHATASE: 68 U/L (ref 9–122)
BKR ANION GAP: 11 g/dL (ref 7–17)
BKR ASPARTATE AMINOTRANSFERASE (AST): 19 U/L (ref 10–35)
BKR AST/ALT RATIO: 1.1
BKR BILIRUBIN TOTAL: 0.2 mg/dL (ref 0.0–<=1.2)
BKR BLOOD UREA NITROGEN: 15 mg/dL (ref 6–20)
BKR BUN / CREAT RATIO: 18.8 (ref 8.0–23.0)
BKR CALCIUM: 9.6 mg/dL (ref 8.8–10.2)
BKR CHLORIDE: 100 mmol/L — ABNORMAL LOW (ref 98–107)
BKR CO2: 29 mmol/L (ref 20–30)
BKR CREATININE: 0.8 mg/dL (ref 0.40–1.30)
BKR EGFR (AFR AMER): >60 mL/min/1.73m2 (ref >60)
BKR EGFR (NON AFRICAN AMERICAN): >60 mL/min/1.73m2 (ref >60)
BKR GLOBULIN: 3.2 g/dL (ref 2.3–3.5)
BKR GLUCOSE: 102 mg/dL — ABNORMAL HIGH (ref 70–100)
BKR POTASSIUM: 3.8 mmol/L (ref 3.3–5.3)
BKR PROTEIN TOTAL: 7.5 g/dL (ref 6.6–8.7)
BKR SODIUM: 140 mmol/L (ref 136–144)
BKR WAM ANALYZER ANC: 19 U/L (ref 10–35)
BKR WAM LYMPHOCYTES: 18.8 % (ref 8.0–23.0)

## 2020-10-18 LAB — CHOLESTEROL, TOTAL: BKR CHOLESTEROL: 161 mg/dL

## 2020-10-18 LAB — HDL CHOLESTEROL: BKR HDL CHOLESTEROL: 64 mg/dL (ref >=40)

## 2020-10-18 LAB — PREALBUMIN: BKR PREALBUMIN: 16 mg/dL — ABNORMAL LOW (ref 20.0–40.0)

## 2020-10-18 LAB — PTH, INTACT WITHOUT CALCIUM: BKR PARATHYROID HORMONE INTACT: 53.2 pg/mL (ref 15.0–65.0)

## 2020-10-18 LAB — FERRITIN: BKR FERRITIN: 184 ng/mL — ABNORMAL HIGH (ref 13–150)

## 2020-10-18 LAB — IRON AND TIBC
BKR IRON SATURATION: 16 % (ref 15–50)
BKR IRON: 38 ug/dL (ref 37–145)
BKR TOTAL IRON BINDING CAPACITY: 239 ug/dL — ABNORMAL LOW (ref 250–450)

## 2020-10-18 LAB — TRIGLYCERIDES: BKR TRIGLYCERIDES: 67 mg/dL

## 2020-10-18 LAB — VITAMIN B12: BKR VITAMIN B12: 1654 pg/mL — ABNORMAL HIGH (ref 232–1245)

## 2020-10-18 LAB — LDL CHOLESTEROL, DIRECT: BKR LDL CHOLESTEROL DIRECT: 83 mg/dL

## 2020-10-18 LAB — FOLATE: BKR FOLATE: >20 ng/mL

## 2020-10-19 ENCOUNTER — Inpatient Hospital Stay: Admit: 2020-10-19 | Discharge: 2020-10-19 | Payer: MEDICAID

## 2020-10-19 DIAGNOSIS — Z1231 Encounter for screening mammogram for malignant neoplasm of breast: Secondary | ICD-10-CM

## 2020-10-19 LAB — VITAMIN D 25 (VITAMIN D STATUS)(MINMETABLAB)(YH): VITAMIN D 25-HYDROXY: 38 ng/mL (ref 20–50)

## 2020-10-20 LAB — ZINC: ZINC: 68 ug/dL (ref 60–130)

## 2020-10-22 LAB — VITAMIN B1, WHOLE BLOOD: VITAMIN B1, WHOLE BLOOD: 142 nmol/L (ref 78–185)

## 2020-10-23 ENCOUNTER — Encounter: Admit: 2020-10-23 | Payer: PRIVATE HEALTH INSURANCE | Attending: Family

## 2020-11-06 ENCOUNTER — Encounter: Admit: 2020-11-06 | Payer: PRIVATE HEALTH INSURANCE

## 2020-11-06 ENCOUNTER — Ambulatory Visit: Admit: 2020-11-06 | Payer: MEDICAID

## 2020-11-06 DIAGNOSIS — K219 Gastro-esophageal reflux disease without esophagitis: Secondary | ICD-10-CM

## 2020-11-06 DIAGNOSIS — Z9989 Dependence on other enabling machines and devices: Secondary | ICD-10-CM

## 2020-11-06 DIAGNOSIS — E7800 Pure hypercholesterolemia, unspecified: Secondary | ICD-10-CM

## 2020-11-06 DIAGNOSIS — E611 Iron deficiency: Secondary | ICD-10-CM

## 2020-11-06 DIAGNOSIS — G629 Polyneuropathy, unspecified: Secondary | ICD-10-CM

## 2020-11-06 DIAGNOSIS — G4733 Obstructive sleep apnea (adult) (pediatric): Secondary | ICD-10-CM

## 2020-11-06 DIAGNOSIS — I4891 Unspecified atrial fibrillation: Secondary | ICD-10-CM

## 2020-11-06 DIAGNOSIS — M1990 Unspecified osteoarthritis, unspecified site: Secondary | ICD-10-CM

## 2020-11-06 DIAGNOSIS — D649 Anemia, unspecified: Secondary | ICD-10-CM

## 2020-11-06 DIAGNOSIS — E119 Type 2 diabetes mellitus without complications: Secondary | ICD-10-CM

## 2020-11-06 DIAGNOSIS — I10 Essential (primary) hypertension: Secondary | ICD-10-CM

## 2020-11-06 DIAGNOSIS — Z87898 Personal history of other specified conditions: Secondary | ICD-10-CM

## 2020-11-14 NOTE — Other
Informed pt of normal mammography. Informed that its recommended to be done every 1-2 years. Pt verbalized understandingCCM: 5 minutes8/05/2020 Erling Cruz, LPN.

## 2020-11-14 NOTE — Other
The screening mammogram did not show any clinically significant abnormalities. You should continue the screening mammogram every 1-2 years.

## 2020-11-20 ENCOUNTER — Encounter: Admit: 2020-11-20 | Payer: PRIVATE HEALTH INSURANCE

## 2020-12-28 ENCOUNTER — Encounter: Admit: 2020-12-28 | Payer: PRIVATE HEALTH INSURANCE

## 2021-01-09 ENCOUNTER — Inpatient Hospital Stay: Admit: 2021-01-09 | Discharge: 2021-01-09 | Payer: MEDICAID

## 2021-01-09 DIAGNOSIS — M170 Bilateral primary osteoarthritis of knee: Secondary | ICD-10-CM

## 2021-01-24 ENCOUNTER — Encounter: Admit: 2021-01-24 | Payer: PRIVATE HEALTH INSURANCE | Attending: Family

## 2021-01-24 ENCOUNTER — Telehealth: Admit: 2021-01-24 | Payer: PRIVATE HEALTH INSURANCE

## 2021-01-24 NOTE — Progress Notes
Confirmation of Verbal Order for Home Medical EquipmentINITAL Patient Name, Address, Bethany Wiggins [place] [place] [phone]                                                                    Account#: [id number] Insured ID: [id number] - (Medicaid)                             Location: 90-01-88-11                    DOB: April 01, 1961  Supplier Name, Address, [facility]phone[facility] [address] [place], [address] [place], [name]  [address]OZDGU:  [phone]:      [phone] or NPI#  [id number] Date Prescribed:  01-24-2021                           Estimated Length of Need: 99 months  1-99(99=lifetime) Patient Diagnosis / ICD10 G47.33	Obstructive sleep apnea (adult) (pediatric) Equipment Prescribed (Including all related accessories are allowed  based on payer allowable); A7030	Full Face Mask 1/3 MO A7031 	Full Face Cush 1/month          Additional Rx Info: ________________________________________________________________________________________________________________________________________________________________________________________ Please verify by signing and dating that your patient meets the applicable coverage criteria for equipment your have prescribedPrescribing PractitionerWrobel, Rosita [facility] [place] [address]ATFT[address] Number: [phone] Number: [phone]: [id number]   Physician signature: Milagros Reap, APRN, FNP-C;  NPI: [id number] Electronically signed January 24, 2021 4:14 PM Physician Code:  [id number]

## 2021-01-24 NOTE — Telephone Encounter
Patient needs a letter stating her diagnosis and why she needs pap therapy. You may call her with any questions and she will pick up letter when ready.

## 2021-01-24 NOTE — Progress Notes
Confirmation of Verbal Order for Home Medical EquipmentINITAL Patient Name, Address, Bethany Wiggins [place] [place] [phone]                                                                    Account#: [id number] Insured ID: [id number] - (Medicaid)                             Location: 90-01-88-11                    DOB: Jun 02, 1960  Supplier Name, Address, [facility]phone[facility] [address] [place], [address] [place], [name]  [address]UXLKG:  [phone]:      [phone] or NPI#  [id number] Date Prescribed:  01-24-2021                           Estimated Length of Need: 99 months  1-99(99=lifetime) Patient Diagnosis / ICD10 G47.33	Obstructive sleep apnea (adult) (pediatric) Equipment Prescribed (Including all related accessories are allowed  based on payer allowable); A7030	Full Face Mask 1/3 MO A7031 	Full Face Cush 1/month          Additional Rx Info: ________________________________________________________________________________________________________________________________________________________________________________________ Please verify by signing and dating that your patient meets the applicable coverage criteria for equipment your have prescribedPrescribing PractitionerWrobel, Rosita [facility] [name] [address]AYTKZ Number: [phone] Number: [id number]: [id number]   Physician signature:  Physician Code:  [id number]

## 2021-01-31 ENCOUNTER — Inpatient Hospital Stay: Admit: 2021-01-31 | Discharge: 2021-01-31 | Payer: MEDICAID

## 2021-01-31 DIAGNOSIS — M79661 Pain in right lower leg: Secondary | ICD-10-CM

## 2021-02-05 ENCOUNTER — Encounter: Admit: 2021-02-05 | Payer: PRIVATE HEALTH INSURANCE

## 2021-02-05 ENCOUNTER — Ambulatory Visit: Admit: 2021-02-05 | Payer: MEDICAID

## 2021-02-05 DIAGNOSIS — G629 Polyneuropathy, unspecified: Secondary | ICD-10-CM

## 2021-02-05 DIAGNOSIS — E119 Type 2 diabetes mellitus without complications: Secondary | ICD-10-CM

## 2021-02-05 DIAGNOSIS — Z87898 Personal history of other specified conditions: Secondary | ICD-10-CM

## 2021-02-05 DIAGNOSIS — E611 Iron deficiency: Secondary | ICD-10-CM

## 2021-02-05 DIAGNOSIS — D649 Anemia, unspecified: Secondary | ICD-10-CM

## 2021-02-05 DIAGNOSIS — I4891 Unspecified atrial fibrillation: Secondary | ICD-10-CM

## 2021-02-05 DIAGNOSIS — G4733 Obstructive sleep apnea (adult) (pediatric): Secondary | ICD-10-CM

## 2021-02-05 DIAGNOSIS — K219 Gastro-esophageal reflux disease without esophagitis: Secondary | ICD-10-CM

## 2021-02-05 DIAGNOSIS — M1990 Unspecified osteoarthritis, unspecified site: Secondary | ICD-10-CM

## 2021-02-05 DIAGNOSIS — E7800 Pure hypercholesterolemia, unspecified: Secondary | ICD-10-CM

## 2021-02-05 DIAGNOSIS — I10 Essential (primary) hypertension: Secondary | ICD-10-CM

## 2021-02-05 DIAGNOSIS — Z9989 Dependence on other enabling machines and devices: Secondary | ICD-10-CM

## 2021-02-07 ENCOUNTER — Emergency Department: Admit: 2021-02-07 | Payer: MEDICAID

## 2021-02-07 ENCOUNTER — Inpatient Hospital Stay: Admit: 2021-02-07 | Discharge: 2021-02-07 | Payer: MEDICAID | Attending: Emergency Medicine

## 2021-02-07 DIAGNOSIS — Z79899 Other long term (current) drug therapy: Secondary | ICD-10-CM

## 2021-02-07 DIAGNOSIS — T148XXA Other injury of unspecified body region, initial encounter: Secondary | ICD-10-CM

## 2021-02-07 DIAGNOSIS — E7800 Pure hypercholesterolemia, unspecified: Secondary | ICD-10-CM

## 2021-02-07 DIAGNOSIS — G4733 Obstructive sleep apnea (adult) (pediatric): Secondary | ICD-10-CM

## 2021-02-07 DIAGNOSIS — I10 Essential (primary) hypertension: Secondary | ICD-10-CM

## 2021-02-07 DIAGNOSIS — M1A49X0 Other secondary chronic gout, multiple sites, without tophus (tophi): Secondary | ICD-10-CM

## 2021-02-07 DIAGNOSIS — K219 Gastro-esophageal reflux disease without esophagitis: Secondary | ICD-10-CM

## 2021-02-07 DIAGNOSIS — E785 Hyperlipidemia, unspecified: Secondary | ICD-10-CM

## 2021-02-07 DIAGNOSIS — E119 Type 2 diabetes mellitus without complications: Secondary | ICD-10-CM

## 2021-02-07 DIAGNOSIS — S0093XA Contusion of unspecified part of head, initial encounter: Secondary | ICD-10-CM

## 2021-02-07 DIAGNOSIS — M25561 Pain in right knee: Secondary | ICD-10-CM

## 2021-02-07 DIAGNOSIS — Z888 Allergy status to other drugs, medicaments and biological substances status: Secondary | ICD-10-CM

## 2021-02-07 DIAGNOSIS — Z87891 Personal history of nicotine dependence: Secondary | ICD-10-CM

## 2021-02-07 DIAGNOSIS — I4891 Unspecified atrial fibrillation: Secondary | ICD-10-CM

## 2021-02-07 DIAGNOSIS — Z7982 Long term (current) use of aspirin: Secondary | ICD-10-CM

## 2021-02-07 DIAGNOSIS — R55 Syncope and collapse: Principal | ICD-10-CM

## 2021-02-07 DIAGNOSIS — Z9989 Dependence on other enabling machines and devices: Secondary | ICD-10-CM

## 2021-02-07 DIAGNOSIS — M542 Cervicalgia: Secondary | ICD-10-CM

## 2021-02-07 LAB — BASIC METABOLIC PANEL
BKR ANION GAP: 11 (ref 7–17)
BKR BLOOD UREA NITROGEN: 16 mg/dL (ref 6–20)
BKR BUN / CREAT RATIO: 22.9 (ref 8.0–23.0)
BKR CALCIUM: 10.1 mg/dL (ref 8.8–10.2)
BKR CHLORIDE: 98 mmol/L (ref 98–107)
BKR CO2: 29 mmol/L (ref 20–30)
BKR CREATININE: 0.7 mg/dL (ref 0.40–1.30)
BKR EGFR, CREATININE (CKD-EPI 2021): >60 mL/min/{1.73_m2} (ref >=60)
BKR GLUCOSE: 82 mg/dL (ref 70–100)
BKR POTASSIUM: 3.5 mmol/L (ref 3.3–5.3)
BKR SODIUM: 138 mmol/L (ref 136–144)

## 2021-02-07 LAB — CBC WITH AUTO DIFFERENTIAL
BKR WAM ABSOLUTE IMMATURE GRANULOCYTES.: 0.01 x 1000/ÂµL (ref 0.00–0.30)
BKR WAM ABSOLUTE LYMPHOCYTE COUNT.: 1.98 x 1000/ÂµL (ref 0.60–3.70)
BKR WAM ABSOLUTE NRBC (2 DEC): 0 x 1000/ÂµL (ref 0.00–1.00)
BKR WAM ANALYZER ANC: 2.79 x 1000/ÂµL (ref 2.00–7.60)
BKR WAM BASOPHIL ABSOLUTE COUNT.: 0.02 x 1000/ÂµL (ref 0.00–1.00)
BKR WAM BASOPHILS: 0.4 % (ref 0.0–1.4)
BKR WAM EOSINOPHIL ABSOLUTE COUNT.: 0.2 x 1000/ÂµL (ref 0.00–1.00)
BKR WAM EOSINOPHILS: 3.8 % (ref 0.0–5.0)
BKR WAM HEMATOCRIT (2 DEC): 37.3 % (ref 35.00–45.00)
BKR WAM HEMOGLOBIN: 12.6 g/dL (ref 11.7–15.5)
BKR WAM IMMATURE GRANULOCYTES: 0.2 % (ref 0.0–1.0)
BKR WAM LYMPHOCYTES: 37.3 % (ref 17.0–50.0)
BKR WAM MCH (PG): 26.8 pg — ABNORMAL LOW (ref 27.0–33.0)
BKR WAM MCHC: 33.8 g/dL (ref 31.0–36.0)
BKR WAM MCV: 79.4 fL — ABNORMAL LOW (ref 80.0–100.0)
BKR WAM MONOCYTE ABSOLUTE COUNT.: 0.31 x 1000/ÂµL (ref 0.00–1.00)
BKR WAM MONOCYTES: 5.8 % (ref 4.0–12.0)
BKR WAM MPV: 8.9 fL (ref 8.0–12.0)
BKR WAM NEUTROPHILS: 52.5 % (ref 39.0–72.0)
BKR WAM NUCLEATED RED BLOOD CELLS: 0 % (ref 0.0–1.0)
BKR WAM PLATELETS: 323 x1000/ÂµL (ref 150–420)
BKR WAM RDW-CV: 15 % (ref 11.0–15.0)
BKR WAM RED BLOOD CELL COUNT.: 4.7 M/ÂµL (ref 4.00–6.00)
BKR WAM WHITE BLOOD CELL COUNT: 5.3 x1000/ÂµL (ref 4.0–11.0)

## 2021-02-07 LAB — TROPONIN T HIGH SENSITIVITY, 1 HOUR WITH REFLEX (BH GH LMW YH)
BKR TROPONIN T HS 1 HOUR DELTA FROM 0 HOUR: 0 ng/L
BKR TROPONIN T HS 1 HOUR: 10 ng/L

## 2021-02-07 LAB — TROPONIN T HIGH SENSITIVITY, 0 HOUR BASELINE WITH REFLEX (BH GH LMW YH): BKR TROPONIN T HS 0 HOUR BASELINE: 10 ng/L

## 2021-02-07 MED ORDER — LIDOCAINE 5 % ADHESIVE PATCH
5 % | MEDICATED_PATCH | TRANSDERMAL | 1 refills | Status: AC
Start: 2021-02-07 — End: 2021-02-15

## 2021-02-07 NOTE — ED Notes
1:05 PM pt brought to A-side from express care. Pending Bassett and troponin's. Pt presented to ED for possible syncopal episode, complaining of right sided pain. Alert and oriented at this time. Able to ambulate to bathroom without difficulty. Past Medical History: Diagnosis Date ? A-fib (HC Code) (HC CODE) (HC Code)  ? Anemia  ? Arthritis  ? CPAP (continuous positive airway pressure) dependence   has c-pap; doesnt use it ;  too much pressure ? Diabetes mellitus (HC Code)  ? GERD (gastroesophageal reflux disease)  ? High cholesterol  ? History of crack cocaine use  ? Hypertension  ? Iron deficiency  ? Neuropathy (HC Code)  ? Obstructive sleep apnea

## 2021-02-07 NOTE — ED Provider Notes
HistoryChief Complaint Patient presents with ? Syncope   Fall while cooking food yesterday. No syncope but unsure shy she fell. Reports feeling just fine prior. (+)Head trauma. Able to get back up after fall. R front tooth moving since. Now complaining R shoulder, R knee discomfort. Ambulatory. AAOx4. Neuro intact. No CP/SOB/fever. Well appearing.   The history is provided by the patient. FallIncident occurred:  6 to 12 hours agoFall occurred: Patient was leaning forward while sitting in chair and she lost balance and fell to floor, striking her face on floor.Height of fall:  1 to 2 ftImpact surface:  A hard floorBlood loss:  No blood lossPoint of impact:  Head, right shoulder and right kneePain location:  Head, right shoulder and right kneeNumeric pain scale:  6/10Pain severity:  ModerateAmbulatory after event?: Yes  Entrapped after fall?: No  Drug use?: No  Alcohol use?: No  Associated symptoms: no visual change, no fever, no numbness, no abdominal pain, no bowel incontinence, no nausea, no vomiting, no loss of consciousness and no tingling  Worsened by:  Activity Past Medical History: Diagnosis Date ? A-fib (HC Code) (HC CODE) (HC Code)  ? Anemia  ? Arthritis  ? CPAP (continuous positive airway pressure) dependence   has c-pap; doesnt use it ;  too much pressure ? Diabetes mellitus (HC Code)  ? GERD (gastroesophageal reflux disease)  ? High cholesterol  ? History of crack cocaine use  ? Hypertension  ? Iron deficiency  ? Neuropathy (HC Code)  ? Obstructive sleep apnea  Past Surgical History: Procedure Laterality Date ? DENTAL SURGERY   ? SLEEVE GASTROPLASTY  05/11/2020 ? SPLIT SLEEP STUDY  03/16/2018 Family History Problem Relation Age of Onset ? Hypertension Father  ? Diabetes Father  ? Coronary Artery Disease Father       stents ? Cervical cancer Sister  ? Diabetes Paternal Aunt  ? Hypertension Paternal Aunt  ? Uterine cancer Paternal Aunt       ?uterine cancner ? Diabetes Paternal Uncle  ? Hypertension Paternal Uncle  ? Breast cancer Neg Hx  ? Ovarian cancer Neg Hx  ? Colon cancer Neg Hx  Social History Socioeconomic History ? Marital status: Single Tobacco Use ? Smoking status: Former Smoker   Packs/day: 0.10   Years: 40.00   Pack years: 4.00   Types: Cigarettes   Quit date: 10/16/2018   Years since quitting: 2.3 ? Smokeless tobacco: Never Used ? Tobacco comment: 1/2-1ppd since age 63; quit 10/2018 w/ some relapses Vaping Use ? Vaping Use: Never used Substance and Sexual Activity ? 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 Drug use: Not Currently   Types: Cocaine   Comment: long time hx of cocaine abuse'/ not in past year ? 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  -Patient reports live in friend's house, would like her own place, therefore Clinical research associate referred her to the [employer] and [employer] [address] office.  -No income, lost her job and  pending on unemployment benefit. Food insecurity.  -Transportation, she have her own vehicle. ED Other Social History ? E-cigarette status Never User  ? E-Cigarette Use Never User  E-cigarette/Vaping Substances E-cigarette/Vaping Devices Review of Systems Constitutional: Negative for chills, diaphoresis, fatigue and fever. Respiratory: Negative for cough, chest tightness and shortness of breath.  Cardiovascular: Negative for chest pain, palpitations and leg swelling. Gastrointestinal: Negative for abdominal pain, bowel incontinence, diarrhea, nausea and vomiting. Genitourinary: Negative for dysuria and pelvic pain. Musculoskeletal: Negative for back pain, myalgias, neck pain and neck stiffness. Neurological: Negative for tingling, loss of consciousness and numbness.  Physical ExamED Triage VitalsBP: 99/66 [02/07/21 1109]Pulse: (!) 54 [02/07/21 1109]Pulse from  O2 sat: n/aResp: (!) 54 [02/07/21 1109]Temp: 98.2 ?F (36.8 ?C) [02/07/21 1109]Temp src: Temporal [02/07/21 1424]SpO2: 97 % [02/07/21 1109] BP 100/66  - Pulse 63  - Temp 98 ?F (36.7 ?C) (Temporal)  - Resp 18  - SpO2 97% Physical ExamVitals and nursing note reviewed. Constitutional:     General: She is not in acute distress.   Appearance: Normal appearance. She is obese. She is not ill-appearing, toxic-appearing or diaphoretic. HENT:    Head: Normocephalic. Contusion present. No raccoon eyes, Battle's sign, abrasion or masses.    Jaw: There is normal jaw occlusion. No trismus or pain on movement.    Comments: + right maxillary tenderness   Nose: Nose normal.    Mouth/Throat:    Lips: Pink.    Mouth: Mucous membranes are moist.    Dentition: Dental tenderness present.    Tongue: No lesions. Tongue does not deviate from midline.    Palate: No mass and lesions.    Pharynx: Oropharynx is clear. Uvula midline.    Comments: + right central and lateral incisor tenderness, no fracture or mobilityEyes:    General: Lids are normal.    Extraocular Movements: Extraocular movements intact.    Conjunctiva/sclera: Conjunctivae normal.    Pupils: Pupils are equal, round, and reactive to light. Neck:    Trachea: Trachea and phonation normal. Cardiovascular:    Rate and Rhythm: Normal rate and regular rhythm.    Pulses: Normal pulses.    Heart sounds: Normal heart sounds. Pulmonary:    Effort: Pulmonary effort is normal.    Breath sounds: Normal breath sounds. Chest:    Chest wall: No deformity or tenderness. Abdominal:    General: Bowel sounds are normal.    Palpations: Abdomen is soft.    Tenderness: There is no right CVA tenderness, left CVA tenderness or guarding. Musculoskeletal:    Cervical back: Normal range of motion and neck supple. Tenderness and bony tenderness present. No deformity, spasms or torticollis. Pain with movement and spinous process tenderness present. No muscular tenderness. Normal range of motion.    Thoracic back: Normal.    Lumbar back: Normal. Skin:   General: Skin is warm.    Capillary Refill: Capillary refill takes less than 2 seconds.    Coloration: Skin is not pale.    Findings: Bruising present. No abrasion, laceration or rash. Neurological:    General: No focal deficit present.    Mental Status: She is alert and oriented to person, place, and time.    Cranial Nerves: Cranial nerves are intact.    Sensory: Sensation is intact.    Motor: Motor function is intact.    Coordination: Coordination is intact.    Gait: Gait is intact.    Comments: Neg pronator driftNeg romberg  ProceduresProceduresResident/APP MDM:59 yo female pmhx afib, DM, hyperlipidemia, htn, anemia, GERD,  presents fto ED complaining of headache, neck pain, facial pain, right shoulder, right knee pain s/p fall yesterday. Patient was leaning forward out of chair to pick something up off of floor and lost balance and fell to floor striking her face, right shoulder and right knee.BP 100/66  - Pulse 63  - Temp 98 ?F (36.7 ?C) (Temporal)  - Resp 18  - SpO2 97% Well appeaering in NAD, speaking in clear voice in full sentencesHEENT: + maxillary tenderness and small area of bruising, no dental injury, no lacerations, no abrasionsLUNGS: CTA, HEART: RRR, ABD: Soft, non-tender, no massNEURO: NO focal deficitsDDX: 60 yo female pmhx afib, DM, hyperlipidemia, htn, anemia, GERD, presents fto ED complaining of headache, neck pain, facial pain, right shoulder, right knee pain s/p fall yesterday, Multiple contusions, R/o ICH, R/O Fx facial bones, right shoulder, right knee, Cannot exclude near syncopePLANL Syncopal work-up. Head [place], Neck [place], Facial bones [place] all negative. No fx on plain radiographs. Labs and cxr and ecg no acute disease.Patient requests d/c to home. Patient counseled regarding DDX and imaging/lab results. Patient given strict return to ED instructions. Patient verbalized understanding of instructions and agreement with plan.Dr. Frederik Pear consulted regarding patient.ED CourseClinical Impressions as of 02/07/21 1715 Injury of head, initial encounter Muscle strain Multiple contusions  ED DispositionDischarge Tye Savoy, PA10/26/22 1715

## 2021-02-07 NOTE — ED Notes
Pt provided discharge paperwork. Pt verbalized understanding and denies any questions or concerns. PIV discontinued. Pt left department with all belongings.

## 2021-02-07 NOTE — ED Notes
11:18 AM Chief Complaint Patient presents with ? Syncope   Fall while cooking food yesterday. No syncope but unsure shy she fell. Reports feeling just fine prior. (+)Head trauma. Able to get back up after fall. R front tooth moving since. Now complaining R shoulder, R knee discomfort. Ambulatory. AAOx4. Neuro intact. No CP/SOB/fever. Well appearing.  Pt reports bending over to pick something up yesterday and waking up on floor. She does not remember falling. Pt woke up today with soreness on right side. Pt used lidocaine with some effect and tylenol with minimal effect. Plan to transfer to hallway bed once available for better level of care. 12:49 PMHandoff given to RN. Care deferred.

## 2021-02-18 ENCOUNTER — Encounter: Admit: 2021-02-18 | Payer: PRIVATE HEALTH INSURANCE | Attending: Surgery

## 2021-02-18 ENCOUNTER — Encounter: Admit: 2021-02-18 | Payer: PRIVATE HEALTH INSURANCE

## 2021-02-19 ENCOUNTER — Encounter: Admit: 2021-02-19 | Payer: PRIVATE HEALTH INSURANCE | Attending: Pulmonary Disease

## 2021-02-19 ENCOUNTER — Telehealth: Admit: 2021-02-19 | Payer: PRIVATE HEALTH INSURANCE | Attending: Dietician

## 2021-02-19 ENCOUNTER — Encounter: Admit: 2021-02-19 | Payer: PRIVATE HEALTH INSURANCE

## 2021-02-19 NOTE — Telephone Encounter
Nutrition Follow-up11/7/2022Received pt request from Roselie Skinner, RD re: Do I have to drink the premier drinks every day for the rest of my life.Writer called pt on preferred contact [phone]) and spoke w/ pt. Writer clarified use protein shakes as needed and is no longer required to drink all protein shakes as on 2 week post-op liquid diet; pt w/ bariatric surgery on 05/11/20. No food/drink intolerances or n/v/d/c reported at this time. Pt interested in scheduling a follow-up nutrition visit in January/February of 2023. Writer forwarded pt request to scheduler and sent pt MyChart message with bariatric hand method handout for reference. All pt questions answered and no further questions at this time. Time Spent: 10 minutesElectronically Signed by Allen Derry, RD, February 19, 2021

## 2021-02-19 NOTE — Progress Notes
Pt came in the office and requested her records and sleep study because she was trying to get on SS and she wanted it for her own records. Printed and supplied recordsSCP

## 2021-03-21 NOTE — Other
Right knee xray shows moderate to advanced osteoarthritis of the knee. There is a small joint effusion or fluid build up. There is no fracture of dislocation of the bones. Osteoarthritis is a degenerative joint disease characterized by the breakdown and eventual loss of joint cartilage. Cartilage is a protein substance that serves as a cushion between the bones of a joint. In osteoarthritis, the top layer of cartilage breaks down and wears away, allowing bones under the cartilage to rub together. Osteoarthritis occurs as a result of injury, being overweight, aging, wear and tear, and inflammation. Osteophytes are bone spurs that grow on the bones of the spine or around the joints. They form next to joints affected by osteoarthritis. Treatment of osteoarthritis involves pain-reducing medications like tylenol and anti-inflammatory medications like ibuprofen or tylenol, topical anti-inflammatory creams, losing weight, physical therapy, braces, joint injections and sometimes surgery. Adopting a healthy lifestyle, like an anti-inflammatory diet (meaning limiting simple carbohydrates, refined white flour, added sugar, preservatives, and processed/prepackaged foods), maintaining a healthy weight, exercising regularly, and complementary methods of pain relief including heat, ice, massage, yoga, and meditation can all help to reduce pain as well. You can use an elastic knee sleeve for support. Did you do physical therapy? I can also refer you to an orthopedic specialist to discuss options like joint injections or joint replacement.

## 2021-03-21 NOTE — Other
Right leg ultrasound did not identify a blood clot in the veins of the right leg.

## 2021-04-17 ENCOUNTER — Encounter: Admit: 2021-04-17 | Payer: MEDICAID | Attending: Physician Assistant

## 2021-04-17 ENCOUNTER — Encounter: Admit: 2021-04-17 | Payer: PRIVATE HEALTH INSURANCE | Attending: Physician Assistant

## 2021-04-17 ENCOUNTER — Encounter: Admit: 2021-04-17 | Payer: PRIVATE HEALTH INSURANCE

## 2021-04-17 DIAGNOSIS — G4733 Obstructive sleep apnea (adult) (pediatric): Secondary | ICD-10-CM

## 2021-04-17 DIAGNOSIS — I4891 Unspecified atrial fibrillation: Secondary | ICD-10-CM

## 2021-04-17 DIAGNOSIS — Z87898 Personal history of other specified conditions: Secondary | ICD-10-CM

## 2021-04-17 DIAGNOSIS — M1990 Unspecified osteoarthritis, unspecified site: Secondary | ICD-10-CM

## 2021-04-17 DIAGNOSIS — E7800 Pure hypercholesterolemia, unspecified: Secondary | ICD-10-CM

## 2021-04-17 DIAGNOSIS — E119 Type 2 diabetes mellitus without complications: Secondary | ICD-10-CM

## 2021-04-17 DIAGNOSIS — E782 Mixed hyperlipidemia: Secondary | ICD-10-CM

## 2021-04-17 DIAGNOSIS — K219 Gastro-esophageal reflux disease without esophagitis: Secondary | ICD-10-CM

## 2021-04-17 DIAGNOSIS — E611 Iron deficiency: Secondary | ICD-10-CM

## 2021-04-17 DIAGNOSIS — I480 Paroxysmal atrial fibrillation: Secondary | ICD-10-CM

## 2021-04-17 DIAGNOSIS — D649 Anemia, unspecified: Secondary | ICD-10-CM

## 2021-04-17 DIAGNOSIS — G629 Polyneuropathy, unspecified: Secondary | ICD-10-CM

## 2021-04-17 DIAGNOSIS — Z9884 Bariatric surgery status: Secondary | ICD-10-CM

## 2021-04-17 DIAGNOSIS — Z9989 Dependence on other enabling machines and devices: Secondary | ICD-10-CM

## 2021-04-17 DIAGNOSIS — I10 Essential (primary) hypertension: Secondary | ICD-10-CM

## 2021-04-17 NOTE — Progress Notes
VIDEO TELEHEALTH VISIT: This clinician is part of the telehealth program and is conducting this visit in a currently approved location. For this visit the clinician and patient were present via interactive audio & video telecommunications system that permits real-time communications, via the [employer].Patient's use of the telehealth platform followed consent and acknowledges agreement to permit telehealth for this visit. State patient is located in: [place]The clinician is appropriately licensed in the above state to provide care for this visit. Other individuals present during the telehealth encounter and their role/relation: noneIf billing based on time, please complete (Not required if billing based on MDM):                           Total time spent in medical video consultation: 30 minutes; Total time spent by the provider on the day of service, which includes time spent on chart review, medical video consultation, education, coordination of care/services and counseling Because this visit was completed over video, a hands-on physical exam was not performed.  Patient/parent or guardian understands and knows to call back if condition changes.Bariatric Surgery Postoperative Sleeve GastrectomySUBJECTIVE:Bethany Wiggins returns for her bariatric follow up visit today. The patient is 1 year s/p sleeve gastrectomy with Dr. Gwenevere Abbot. She is feeling well but has constipation. Has a bowel movement every 4-5 days if she doesn't have laxative tea and metamucil. Currently on Ozempic with her PCP.Today, she does not complain about reflux.She vomits never.She does not complain about dysphagia.Nicotine history:no	Packs per day:  N/ANSAID use: none	Frequency: N/AOral/IV steroid use: no Frequency: N/ADietary Assessment:24 hour recall?	B: greek yogurt with mandarin oranges ?	S: none OR belvita crackers ?	L: none?	S: fish ?	D: none?	S: bbq pig feet and vegetablesDaily beverage consumption?	Water: 32 oz ?	Coconut Water - Calorie free: 32 oz?	Juice: rarely?	Soda: 1/2 a few times per month?	Coffee: 1-2 servings per day with splenda and oat milk?	Frappuccino/Latte: NeverPhysical Activity:?	Exercises:  Yes: 2/week for 60 minutes per day. Type of activity: physical therapy?	Activity Level: Moderate Activity - rarely sitting at workMental Health:?	Substance use: ?	Alcohol: N/A?	Drugs: N/APHQ-2:Little interest or pleasure in doing things: Not at all Feeling down, depressed, or hopeless: Not at all PHQ-9 Total Score: 0Does patient require a referral to mental health? noTaking vitamins?  yesIf yes, which vitamins are he or she taking? bariatric multivitamin, calciumOBJECTIVE:Today she weighs 117.6 kg and her current Body mass index is 45.92 kg/m?Marland Kitchen Weight 259.2 lbs.Bariatric visit types: Follow upSurgery type: Sleeve gastrectomySurgery Date: 01/27/22Pre Op Weight: 149.7 kgWeight: 117.6 kg (as per pt)BMI (Calculated): 45.9Weight Sadeen Wiegel (%): 21.45Weight Matai Carpenito (lb): 70.8Ideal Weight: 64 kg% Excess Weight Adamarie Izzo: 37.5Physical Exam: Ht 5' 3 (1.6 m)  - Wt 117.6 kg Comment: as per pt - BMI 45.92 kg/m? General:  Alert, oriented, no acute distress Current Outpatient Medications on File Prior to Visit Medication Sig Dispense Refill ? allopurinoL (ZYLOPRIM) 100 mg tablet Take 1 tablet (100 mg total) by mouth daily. 90 tablet 2 ? aspirin 81 mg EC delayed release tablet Take 1 tablet (81 mg total) by mouth daily. 90 tablet 2 ? blood sugar diagnostic (CONTOUR NEXT) test strips Use to check glucose once daily for E11.9 100 each 2 ? calcium carb/vitamin D3/vit K1 (CALCIUM SOFT CHEW ORAL) Take by mouth.    ? cyclobenzaprine (FLEXERIL) 5 mg tablet Take 1 tablet (5 mg total) by mouth 2 (two) times daily as needed for muscle spasms (or muscle cramps). 30 tablet 3 ? fluticasone propionate (FLONASE) 50 mcg/actuation nasal spray Use 2 sprays in each nostril daily. 16 mL 3 ?  furosemide (LASIX) 20 mg tablet Take 1 tablet (20 mg total) by mouth daily. 90 tablet 2 ? gabapentin (NEURONTIN) 600 mg tablet TAKE 1 TABLET BY MOUTH 3 TIMES DAILY WITH MEALS. 90 tablet 2 ? levocetirizine (XYZAL) 5 mg tablet Take 1 tablet (5 mg total) by mouth every evening before dinner. 90 tablet 3 ? lidocaine (LIDODERM) 5 % Place 2-3 patches onto the skin every 24 hours. Remove & Discard patch within 12 hours or as directed by MD 90 patch 3 ? Miscellaneous Medical Supply Please send 4 legged cane, for stability and chronic knee pain, Dx M25.561 1 each 0 ? Miscellaneous Medical Supply Adult incontinence pads once a day as needed. Dx: I69.629 28 each 3 ? Miscellaneous Medical Supply Please send cock up wrist splints for left and right wrists, use daily for 12 weeks, G56.03 2 each 0 ? multivitamin-min-iron-FA-vit K (BARIATRIC MULTIVITAMINS) 45 mg iron- 800 mcg-120 mcg Cap Take by mouth.   ? omeprazole (PRILOSEC) 20 mg capsule Take 1 capsule (20 mg total) by mouth daily. 90 capsule 2 ? rosuvastatin (CRESTOR) 20 mg tablet Take 1 tablet (20 mg total) by mouth daily. 90 tablet 3 ? semaglutide (OZEMPIC) 1 mg/dose (2 mg/1.5 mL) Pen Injector Inject 1 mg under the skin once a week. 3 mL 6 ? sulfamethoxazole-trimethoprim (BACTRIM DS;CO-TRIMOXAZOLE DS) 800-160 mg per tablet Take 1 tablet by mouth 2 (two) times daily for 10 days. 20 tablet 0 ? triamterene-hydroCHLOROthiazide (DYAZIDE) 37.5-25 mg per capsule Take 1 capsule by mouth every morning. 90 capsule 2 No current facility-administered medications on file prior to visit. Her laparoscopic sleeve gastrectomy information:Surgery Date: 05/11/20.Pre Op Weight: 149.7 kg.Recent bariatric labs reviewed: not applicableBariatric labs ordered today: yesPost Operative Co-morbidities Follow BM:WUXLK apnea: unchangedUsing CPAP: YesCurrent Diabetes Mellitus: yes	Insulin Dependent? noNumber of hypertension medications currently prescribed: 2On Hyperlipidemia medications: yesCurrently on medications for GERD? yes		If yes, it is due to symptoms? yes	Is it due to protocol? noAssessment: Bethany Wiggins is doing well after her laparoscopic sleeve gastrectomy. Her dietary compliance is excellent. Advise at least 30 minutes of exercise four times a week. We discussed a referral to our exercise physiologist and she is in agreement with this. Plan:- Continue dietary adherence- Increase exercise regimen - appointment request placed to exercise physiologist - Continue Bariatric Multivitamin with 45mg  iron and Calcium daily- [facility] [place] [address] Continue/Consider attending monthly bariatric support groupFollow up:Bethany Wiggins will follow up in 12 months .

## 2021-04-18 ENCOUNTER — Encounter: Admit: 2021-04-18 | Payer: MEDICAID | Attending: Dietician

## 2021-04-23 ENCOUNTER — Encounter: Admit: 2021-04-23 | Payer: MEDICAID | Attending: Adult Health

## 2021-04-24 ENCOUNTER — Telehealth: Admit: 2021-04-24 | Payer: PRIVATE HEALTH INSURANCE | Attending: Dietician

## 2021-04-24 NOTE — Telephone Encounter
Called patient to reschedule missed appointment. Left voicemail for callback.

## 2021-05-03 ENCOUNTER — Ambulatory Visit: Admit: 2021-05-03 | Payer: MEDICAID | Attending: Orthopedic Surgery

## 2021-05-03 DIAGNOSIS — M170 Bilateral primary osteoarthritis of knee: Secondary | ICD-10-CM

## 2021-05-03 DIAGNOSIS — M25561 Pain in right knee: Secondary | ICD-10-CM

## 2021-05-03 MED ORDER — MELOXICAM 15 MG TABLET
15 mg | ORAL_TABLET | Freq: Every day | ORAL | 3 refills | Status: AC
Start: 2021-05-03 — End: 2021-07-20

## 2021-05-03 NOTE — Progress Notes
Review of Systems Musculoskeletal:      NP - bilateral knee pain Pain 8/10, achyUnsteady gait Limited ROM

## 2021-05-03 NOTE — Progress Notes
Chief Complaint: Bilateral knee pain L>RHPI:Is a 61 y.o. female with a chief complaint of Bilateral knee pain. The pain began approximately several years ago. her pain is in the medial aspect of her Bilateral knee. Pain does not radiate to the lower leg. The pain is rated as a 8 out of 10 with activity and somewhat improved at rest.Patient denies complaints of numbness or tingling of legs. The pain is exacerbated by climbing stairs and ambulation. Navigates stairs + use of the banister. - difficulty with shoes and socks. Pain is not relieved by NSAID's. Patient has been taking Tylenol/Lidocaine patches on a p.r.n. basis as well as using ice. Has completed some physical therapy but did not feel as if the exercises were helpful to her, up until a few weeks ago. She previously went to aquatic physical therapy which she preferred. Weight loss surgery 05/11/2020 and has lost over 100 lbs, notes that her knee pain has not improved much since this. She feels that the pain has actually increased since this surgery, despite no notable changes to her daily life. History of AFIBLives with a roommate and has stairs on the porch entering the apartment and also within the home. Duration: several yearsInjury: -Walking tolerance: a few blocks Limp: -Support: + cane, intermittently Swelling: - Crepitation: +Instability: -Stairs: +Injections: -NSAID's: + Lidocaine patches, creams, Tylenol Prior surgery: -Back pain: -Hip pain: -Risk of AVN : -At this point, the patient has  failed conservative management and is here to discuss operative versus nonoperative treatment.UJW:JXBJ Medical History: Diagnosis Date ? A-fib (HC Code) (HC CODE) (HC Code)  ? Anemia  ? Arthritis  ? CPAP (continuous positive airway pressure) dependence   has c-pap; doesnt use it ;  too much pressure ? Diabetes mellitus (HC Code)  ? GERD (gastroesophageal reflux disease)  ? High cholesterol  ? History of crack cocaine use  ? Hypertension  ? Iron deficiency  ? Neuropathy (HC Code)  ? Obstructive sleep apnea  YNW:GNFA Surgical History: Procedure Laterality Date ? DENTAL SURGERY   ? SLEEVE GASTROPLASTY  05/11/2020 ? SPLIT SLEEP STUDY  03/16/2018 Allergies:Allergies Allergen Reactions ? Lisinopril Angioedema Medications:Current Outpatient Medications on File Prior to Visit Medication Sig Dispense Refill ? allopurinoL (ZYLOPRIM) 100 mg tablet Take 1 tablet (100 mg total) by mouth daily. 90 tablet 2 ? aspirin 81 mg EC delayed release tablet Take 1 tablet (81 mg total) by mouth daily. 90 tablet 2 ? blood sugar diagnostic (CONTOUR NEXT) test strips Use to check glucose once daily for E11.9 100 each 2 ? calcium carb/vitamin D3/vit K1 (CALCIUM SOFT CHEW ORAL) Take by mouth.    ? cyclobenzaprine (FLEXERIL) 5 mg tablet Take 1 tablet (5 mg total) by mouth 2 (two) times daily as needed for muscle spasms (or muscle cramps). 30 tablet 3 ? fluticasone propionate (FLONASE) 50 mcg/actuation nasal spray Use 2 sprays in each nostril daily. 16 mL 3 ? furosemide (LASIX) 20 mg tablet Take 1 tablet (20 mg total) by mouth daily. 90 tablet 2 ? gabapentin (NEURONTIN) 600 mg tablet TAKE 1 TABLET BY MOUTH 3 TIMES DAILY WITH MEALS. 90 tablet 3 ? levocetirizine (XYZAL) 5 mg tablet Take 1 tablet (5 mg total) by mouth every evening before dinner. 90 tablet 3 ? lidocaine (LIDODERM) 5 % Place 2-3 patches onto the skin every 24 hours. Remove & Discard patch within 12 hours or as directed by MD 90 patch 3 ? Miscellaneous Medical Supply Please send 4 legged cane, for stability and chronic knee pain, Dx M25.561  1 each 0 ? Miscellaneous [employer] Supply Adult incontinence pads once a day as needed. Dx: E95.284 28 each 3 ? Miscellaneous Medical Supply Please send cock up wrist splints for left and right wrists, use daily for 12 weeks, G56.03 2 each 0 ? multivitamin-min-iron-FA-vit K (BARIATRIC MULTIVITAMINS) 45 mg iron- 800 mcg-120 mcg Cap Take by mouth.   ? omeprazole (PRILOSEC) 20 mg capsule Take 1 capsule (20 mg total) by mouth daily. 90 capsule 2 ? rosuvastatin (CRESTOR) 20 mg tablet Take 1 tablet (20 mg total) by mouth daily. 90 tablet 3 ? semaglutide (OZEMPIC) 1 mg/dose (2 mg/1.5 mL) Pen Injector Inject 1 mg under the skin once a week. 3 mL 6 ? triamterene-hydroCHLOROthiazide (DYAZIDE) 37.5-25 mg per capsule Take 1 capsule by mouth every morning. 90 capsule 2 No current facility-administered medications on file prior to visit.  SocHx:Social History Occupational History ? Occupation: Unemployed Tobacco Use ? Smoking status: Former   Packs/day: 0.10   Years: 40.00   Pack years: 4.00   Types: Cigarettes   Quit date: 10/16/2018   Years since quitting: 2.5 ? Smokeless tobacco: Never ? Tobacco comments:   1/2-1ppd since age 56; quit 10/2018 w/ some relapses Vaping Use ? Vaping Use: Never used Substance and Sexual Activity ? 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 Drug use: Not Currently   Types: Cocaine   Comment: long time hx of cocaine abuse'/ not in past year ? Sexual activity: Not Currently   Partners: Male   Birth control/protection: Post-menopausal FamHx:she family history includes Cervical cancer in her sister; Coronary Artery Disease in her father; Diabetes in her father, paternal aunt, and paternal uncle; Hypertension in her father, paternal aunt, and paternal uncle; Uterine cancer in her paternal aunt.XLK:GMWNUU see the attached intake ROS.X-ray Interpretation: The patient received a standard set of films of the affected knee including a standing AP/Lat/PA flexion, and a sunrise view.  Moderate to severe medial compartment osteoarthritis bilateral knees.  Varus deformity.Formal Radiology Reading follows below:No results found.Recent Lab Testing:Lab Results Component Value Date  WBC 5.3 02/07/2021  HGB 12.6 02/07/2021  HCT 37.30 02/07/2021  MCV 79.4 (L) 02/07/2021  PLT 323 02/07/2021 Lab Results Component Value Date  CREATININE 0.70 02/07/2021  BUN 16 02/07/2021  NA 138 02/07/2021  K 3.5 02/07/2021  CL 98 02/07/2021  CO2 29 02/07/2021 Lab Results Component Value Date  INR 1.00 07/09/2017  INR 1.00 12/07/2015   Component Value Date/Time  SEDRATE 65 (H) 09/19/2020 0844  SEDRATE 45 (H) 12/22/2019 1605  SEDRATE >130 (H) 11/30/2019 1440  SEDRATE 70 (H) 05/04/2019 1133  SEDRATE 74 (H) 03/17/2019 1202  SEDRATE 72 (H) 02/17/2019 1545 Hemoglobin A1C:Lab Results Component Value Date  HGBA1C 5.9 04/10/2021  HGBA1C 6.0 01/08/2021  HGBA1C 6.0 (H) 09/19/2020  HGBA1C 6.6 07/06/2020  HGBA1C 6.5 (H) 11/30/2019  HGBA1C 6.4 (H) 01/25/2019  HGBA1C 7.0 (H) 12/02/2018  HGBA1C 6.8 (H) 09/02/2018 Micro Lab Testing:No results found for: MICRODiagnosis Coding:  ICD-10-CM  1. Right knee pain, unspecified chronicity  M25.561   Physical exam:This is a well developed female who is alert, oriented times three and in no apparent distress. Skin is intact over the Bilateral knee as well as the lower extremity with no abrasions, lacerations, or ulcerations. Observation of the patient's gait reveals a + antalgic gait with no thrust.  Collateral ligament testing stable. - Lachman's and - posterior drawer. Neurovascularly intact with 5/5 EHL/tibialis anterior/gastroc. Sensation intact to light touch. Palpable,  symmetric dorsalis pedis and posterior tibial pulses in both lower extremities. Hip examination normal.There is pain on palpation of medial joint line. The patient demonstrates grinding anteriorly with ROM. Range of motion: 0 extension to approximately 95 degrees of flexion- bilaterally. Left: opening with valgus stress.Impression and Plan:The patient's symptoms are consistent with moderate primary osteoarthritis of the bilateral knees involving primarily the medial and patellofemoral compartments. I do not recommend surgery at this time. Rather I have referred her for aquatic physical therapy and prescribed 7.5mg  Meloxicam. We have discussed that prior to any future surgery she would need to comply with the required BMI of <40, at this time this would require 20-30lbs of weight loss, she is in understanding. Follow up: 4-6 months A lengthy discussion ensued, wherein the patient was told that if and when the symptoms are intolerable, elective total knee replacement should be considered. The operative procedure was explained using three-dimensional models. The rehabilitation, the potential risks, benefits and alternatives were discussed at length. Specific risks discussed included but were not limited to excessive blood loss and the need for transfusion and therefore the risk of transmissible disease or transfusion reaction, deep infection and the potential need for repetitive debridements, implant removal, long-term antibiotic therapy, possibly requiring deep venous access, extensor mechanism complications, including subluxation or dislocation, disruption of the quadriceps or patellar tendon, fracture of the patella or avulsion of the tibial tuberosity, femoral, tibial or fibular fracture and the need for further surgery for fixation, neurovascular injury with temporary or permanent numbness, tingling, weakness or paralysis, arterial injury necessitating thrombectomy, embolectomy, bypass surgery, fasciotomy for compartment syndrome or even amputation, deep venous thrombosis, pulmonary embolism and death, persistent pain, weakness, or limp, late aseptic loosening and the need for revision, polyethylene wear-induced osteolysis and related problems, post-operative stiffness requiring closed manipulation, and finally, a wide variety of unanticipated medical problems. The opportunity to ask questions and address any concerns was provided. Over 45 minutes spent with patient, more than 50% on face to face counseling and providing coordination of care.Scribed for Jimmie Molly, MD by Marene Lenz, medical scribe May 01, 2021  The documentation recorded by the scribe accurately reflects the services I personally performed and the decisions made by me. I reviewed and confirmed all material entered and/or pre-charted by the scribe. Jimmie Molly, MD

## 2021-05-29 ENCOUNTER — Encounter: Admit: 2021-05-29 | Payer: PRIVATE HEALTH INSURANCE | Attending: Family

## 2021-05-29 NOTE — Progress Notes
Confirmation of Verbal Order for Home Medical EquipmentRECERTIFICATION Patient Name, Address, TelephoneBrenda Angelique Wiggins [id number] [facility] [place] [phone]                                                                    Account#:Insured ID: [id number] - (Medicaid)            [id number]  Location: 90-01-88-11             DOB: [birth year]  Supplier Name, Address, [facility] [address] [place], [place] [address] [phone]:  [phone]       [phone]  NSC or NPI# [id number] Date Prescribed:  02/06/21                          Estimated Length of Need: 99 months  1-99(99=lifetime) Patient Diagnosis / ICD10 G47.33	Obstructive sleep apnea (adult) (pediatric) Equipment Prescribed (Including all related accessories are allowed  based on payer allowable);A4604	Tubing Heated  1/3 [id number]	Nasal Pillows 2 x 1 [id number]	 Nasal or Mask Applctn Device 1 per 3 [id number]	Filter, Ultra Fine  2 per [id number]	Water Chamber for Humidifier PAP 1 per 6 months            Additional Rx Info: ________________________________________________________________________________________________________________________________________________________________________________________ Please verify by signing and dating that your patient meets the applicable coverage criteria for equipment your have prescribedPrescribing PractitionerWrobel, [facility] [address] [place] [address]RSWNI Number: [id number] Number: [phone]: [id number]   Physician signature: Milagros Reap, APRN, FNP-C;  NPI: [id number] Electronically signed May 29, 2021 11:57 AM

## 2021-05-29 NOTE — Progress Notes
Confirmation of Verbal Order for Home Medical EquipmentRECERTIFICATION Patient Name, Address, TelephoneBrenda Angelique Wiggins [id number] [facility] [place] [phone]                                                                    Account#:Insured ID: [id number] - (Medicaid)            [id number]  Location: 90-01-88-11             DOB: [birth year]  Supplier Name, Address, [facility] [address] [place]. [address] [place], [place] [address] [phone]:  [phone]       [phone]  NSC or NPI# [id number] Date Prescribed:  02/06/21                          Estimated Length of Need: 99 months  1-99(99=lifetime) Patient Diagnosis / ICD10 G47.33	Obstructive sleep apnea (adult) (pediatric) Equipment Prescribed (Including all related accessories are allowed  based on payer allowable);A4604	Tubing Heated  1/3 [id number]	Nasal Pillows 2 x 1 [id number]	 Nasal or Mask Applctn Device 1 per 3 [id number]	Filter, Ultra Fine  2 per [id number]	Water Chamber for Humidifier PAP 1 per 6 months            Additional Rx Info: ________________________________________________________________________________________________________________________________________________________________________________________ Please verify by signing and dating that your patient meets the applicable coverage criteria for equipment your have prescribedPrescribing PractitionerWrobel, [facility] [address] [place] [address]EGBTD Number: [phone] Number: [phone]: [phone]   Physician signature:

## 2021-07-08 ENCOUNTER — Emergency Department: Admit: 2021-07-08 | Payer: MEDICAID

## 2021-07-08 ENCOUNTER — Inpatient Hospital Stay: Admit: 2021-07-08 | Discharge: 2021-07-08 | Payer: MEDICAID

## 2021-07-08 DIAGNOSIS — Z79899 Other long term (current) drug therapy: Secondary | ICD-10-CM

## 2021-07-08 DIAGNOSIS — Z7982 Long term (current) use of aspirin: Secondary | ICD-10-CM

## 2021-07-08 DIAGNOSIS — E119 Type 2 diabetes mellitus without complications: Secondary | ICD-10-CM

## 2021-07-08 DIAGNOSIS — I10 Essential (primary) hypertension: Secondary | ICD-10-CM

## 2021-07-08 DIAGNOSIS — Z7985 Long-term (current) use of injectable non-insulin antidiabetic drugs: Secondary | ICD-10-CM

## 2021-07-08 DIAGNOSIS — I480 Paroxysmal atrial fibrillation: Secondary | ICD-10-CM

## 2021-07-08 DIAGNOSIS — M25511 Pain in right shoulder: Secondary | ICD-10-CM

## 2021-07-08 DIAGNOSIS — Z888 Allergy status to other drugs, medicaments and biological substances status: Secondary | ICD-10-CM

## 2021-07-08 DIAGNOSIS — Z791 Long term (current) use of non-steroidal anti-inflammatories (NSAID): Secondary | ICD-10-CM

## 2021-07-08 DIAGNOSIS — M170 Bilateral primary osteoarthritis of knee: Secondary | ICD-10-CM

## 2021-07-08 DIAGNOSIS — G44319 Acute post-traumatic headache, not intractable: Secondary | ICD-10-CM

## 2021-07-08 DIAGNOSIS — Z9884 Bariatric surgery status: Secondary | ICD-10-CM

## 2021-07-08 NOTE — ED Notes
11:23 AM Chief Complaint Patient presents with ? Fall   Patient reports mechanical fall down one step. Patient reports hitting her head on the ground. Patient denies LOC or blood thinners. Patient reports right shoulder pain and arrives with bleeding from her thumb nail. Pt able to perform ROM with upper right extremity Vitals:  07/08/21 1112 BP: 118/85 Pulse: 81 Resp: 18 Temp: 98 ?F (36.7 ?C) 12:59 PMPt cleared for DC by provider. Provider completed DC education and gave Pt education handouts.

## 2021-07-08 NOTE — ED Provider Notes
Chief Complaint Patient presents with ? Fall   Patient reports mechanical fall down one step. Patient reports hitting her head on the ground. Patient denies LOC or blood thinners. Patient reports right shoulder pain and arrives with bleeding from her thumb nail. Pt able to perform ROM with upper right extremity Medical Decision Making37 year old female with a past medical history of pAF, not on AC, HTN, arthritis, DM, OSA, s/p sleeve gastrectomy who presents to the ED after a fall. 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 No LOC. Was ambulatory after. She reports mainly occipital head pain, neck pain, and right shoulder pain. She also noticed some bleeding near her right thumb nail. No other injuries. MDM: mechanical fall vs cervical neck strain vs head contusion vs muscle strain vs low concern for ICH Plan:[place] Cervical Spine XR right shoulder XR right thumb Patient declined any pain medication at this time Results:[place] Head Cervical Spine: Impression:	1. No evidence of acute intracranial abnormality. ? 2. No evidence for acute cervical spine fracture or traumatic subluxation. XR right shoulder: Impression:	No acute osseous injury. Mild degenerative changes seen within the [facility] joint. XR right thumb: Impression:	No acute osseous injury. Dispo: Discharge to home. Instructed patient to take Tylenol as needed for pain, ice, use lidoderm patches as needed and to rest as she will likely be sore. She demonstrated understanding of all discharge instructions. Dr. Pilar Grammes available for consultation. Mellody Dance, PA-CPlease see Amion for MHBAcute pain of right shoulder: acute illness or injuryAcute post-traumatic headache, not intractable: acute illness or injuryFall, initial encounter: acute illness or injuryAmount and/or Complexity of Data ReviewedRadiology: ordered.  Physical ExamED Triage Vitals [07/08/21 1112]BP: 118/85Pulse: 81Pulse from  O2 sat: n/aResp: 18Temp: 98 ?F (36.7 ?C)Temp src: TemporalSpO2: 95 % BP 118/85  - Pulse 81  - Temp 98 ?F (36.7 ?C) (Temporal)  - Resp 18  - Wt 119 kg  - SpO2 95%  - BMI 46.47 kg/m? Physical ExamVitals reviewed. Constitutional:     Appearance: Normal appearance. HENT:    Head: Normocephalic and atraumatic. Eyes:    Conjunctiva/sclera: Conjunctivae normal. Neck:    Comments: Some midline, mainly right sided paraspinal tenderness, near right shoulder, with full ROM to the neck, no deformity Cardiovascular:    Rate and Rhythm: Normal rate. Pulmonary:    Effort: Pulmonary effort is normal. Musculoskeletal:       General: Tenderness present. Normal range of motion.    Cervical back: Normal range of motion. Tenderness present. No rigidity.    Comments: No deformity to right shoulder, radial pulse to right hand 2+ Dried blood near right thumb, nail is not loose, no subungual hematoma, no deformity to thumb Able to move bilateral legs at knees w/o difficulty, no bruising, abrasions, deformities palpated  Skin:   General: Skin is warm and dry. Neurological:    General: No focal deficit present.    Mental Status: She is alert.  ProceduresAttestation/Critical CareClinical Impressions as of 07/08/21 1301 Fall, initial encounter Acute pain of right shoulder Acute post-traumatic headache, not intractable  ED DispositionDischarge Mellody Dance, PA03/26/23 1301

## 2021-07-08 NOTE — Discharge Instructions
Take Tylenol every 4-6 hours as needed for your pain, as you will likely be sore for a few days. Ice any areas of pain and you can use the over the counter lidoderm patches (Salonpas brand). Rest and avoid any strenuous activities. Return for any new or worsening symptoms.

## 2021-07-20 ENCOUNTER — Encounter: Admit: 2021-07-20 | Payer: PRIVATE HEALTH INSURANCE | Attending: Medical

## 2021-07-20 DIAGNOSIS — M25561 Pain in right knee: Secondary | ICD-10-CM

## 2021-07-20 DIAGNOSIS — M170 Bilateral primary osteoarthritis of knee: Secondary | ICD-10-CM

## 2021-07-20 MED ORDER — MELOXICAM 15 MG TABLET
15 mg | ORAL_TABLET | Freq: Every day | ORAL | 3 refills | Status: AC
Start: 2021-07-20 — End: ?

## 2021-07-24 ENCOUNTER — Encounter: Admit: 2021-07-24 | Payer: PRIVATE HEALTH INSURANCE | Attending: Family

## 2021-07-24 NOTE — Progress Notes
Confirmation of Verbal Order for Home Medical EquipmentRecertification Patient Name, Address, Bethany Wiggins [address] [place] [phone]                                                                    Account#: [id number] Insured ID: [id number] - (Medicaid)              Location:90-01-88-11               DOB: 11-04-1960  Supplier Name, Address, [facility] [address] [place]. [address] [place], [place] [address] [phone]:  [id number] or NPI# [id number] Date Prescribed: 07/24/21                        Estimated Length of Need: 99 months  1-99(99=lifetime) Patient Diagnosis / ICD10 G47.33	Obstructive sleep apnea (adult) (pediatric) Equipment Prescribed (Including all related accessories are allowed  based on payer allowable); A7035	 Headgear 1/6 months A7039	Filter Non disposable 1/6 mo          Additional Rx Info: ________________________________________________________________________________________________________________________________________________________________________________________ Please verify by signing and dating that your patient meets the applicable coverage criteria for equipment your have prescribedPrescribing PractitionerWrobel, [facility] [address] [place] [address]OVFIE Number: [phone] Number: [phone]: [id number]   Physician signature: Milagros Reap, APRN, FNP-C;  NPI: [id number] Electronically signed July 24, 2021 12:27 PM

## 2021-07-24 NOTE — Progress Notes
Confirmation of Verbal Order for Home Medical EquipmentRecertification Patient Name, Address, Bethany Wiggins [address] [place] [phone]                                                                    Account#: [id number] Insured ID: [id number] - (Medicaid)              Location:90-01-88-11               DOB: [birth year]  Supplier Name, Address, [facility] [address] [place]. [address] [place], [place] [address] [phone]:  [phone] or NPI# [id number] Date Prescribed: 07/24/21                        Estimated Length of Need: 99 months  1-99(99=lifetime) Patient Diagnosis / ICD10 G47.33	Obstructive sleep apnea (adult) (pediatric) Equipment Prescribed (Including all related accessories are allowed  based on payer allowable); A7035	 Headgear 1/6 months A7039	Filter Non disposable 1/6 mo          Additional Rx Info: ________________________________________________________________________________________________________________________________________________________________________________________ Please verify by signing and dating that your patient meets the applicable coverage criteria for equipment your have prescribedPrescribing PractitionerWrobel, [facility] [address] [place] [address]OVFIE Number: [phone] Number: [phone]: [id number]   Physician signature:

## 2021-08-06 ENCOUNTER — Encounter: Admit: 2021-08-06 | Payer: MEDICAID

## 2021-08-23 ENCOUNTER — Ambulatory Visit: Admit: 2021-08-23 | Payer: MEDICAID | Attending: Family
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
## 2022-04-16 ENCOUNTER — Encounter: Admit: 2022-04-16 | Payer: PRIVATE HEALTH INSURANCE | Attending: Physician Assistant

## 2022-06-04 ENCOUNTER — Encounter: Admit: 2022-06-04 | Payer: PRIVATE HEALTH INSURANCE

## 2023-10-08 ENCOUNTER — Encounter: Admit: 2023-10-08 | Payer: PRIVATE HEALTH INSURANCE

## 2023-11-03 ENCOUNTER — Encounter: Admit: 2023-11-03 | Payer: PRIVATE HEALTH INSURANCE

## 2024-01-15 ENCOUNTER — Telehealth: Admit: 2024-01-15 | Payer: PRIVATE HEALTH INSURANCE

## 2024-01-15 NOTE — Telephone Encounter
 Bariatric surgery Spoke with patient s/p 2022 bariatric surgeryPlans [place]Patient [place] [facility] [place], can see operative reportShe will have her RN Navigator call me
